# Patient Record
Sex: Female | Born: 1935 | Race: White | Hispanic: No | Marital: Married | Smoking: Never smoker
Health system: Southern US, Community
[De-identification: ages and names within clinical notes are randomized; demographics above are authoritative.]

## PROBLEM LIST (undated history)

## (undated) DIAGNOSIS — M810 Age-related osteoporosis without current pathological fracture: Secondary | ICD-10-CM

## (undated) DIAGNOSIS — E119 Type 2 diabetes mellitus without complications: Secondary | ICD-10-CM

## (undated) DIAGNOSIS — E7800 Pure hypercholesterolemia, unspecified: Secondary | ICD-10-CM

## (undated) DIAGNOSIS — I10 Essential (primary) hypertension: Secondary | ICD-10-CM

## (undated) DIAGNOSIS — K219 Gastro-esophageal reflux disease without esophagitis: Secondary | ICD-10-CM

## (undated) HISTORY — PX: KNEE SURGERY: SHX244

## (undated) HISTORY — PX: ABDOMINAL HYSTERECTOMY: SHX81

## (undated) HISTORY — PX: APPENDECTOMY: SHX54

---

## 2004-09-02 ENCOUNTER — Ambulatory Visit: Payer: Self-pay | Admitting: Internal Medicine

## 2005-01-10 ENCOUNTER — Ambulatory Visit: Payer: Self-pay | Admitting: Gastroenterology

## 2005-03-06 ENCOUNTER — Ambulatory Visit: Payer: Self-pay | Admitting: Internal Medicine

## 2005-04-16 ENCOUNTER — Ambulatory Visit: Payer: Self-pay | Admitting: Internal Medicine

## 2005-07-30 ENCOUNTER — Ambulatory Visit: Payer: Self-pay | Admitting: Internal Medicine

## 2005-09-04 ENCOUNTER — Ambulatory Visit: Payer: Self-pay | Admitting: Internal Medicine

## 2005-11-21 ENCOUNTER — Other Ambulatory Visit: Payer: Self-pay

## 2005-12-02 ENCOUNTER — Inpatient Hospital Stay: Payer: Self-pay | Admitting: General Practice

## 2006-09-08 ENCOUNTER — Ambulatory Visit: Payer: Self-pay | Admitting: Internal Medicine

## 2006-12-07 ENCOUNTER — Ambulatory Visit: Payer: Self-pay | Admitting: Internal Medicine

## 2006-12-10 ENCOUNTER — Ambulatory Visit: Payer: Self-pay | Admitting: Internal Medicine

## 2006-12-17 ENCOUNTER — Ambulatory Visit: Payer: Self-pay | Admitting: Internal Medicine

## 2007-01-10 ENCOUNTER — Ambulatory Visit: Payer: Self-pay | Admitting: Internal Medicine

## 2007-01-14 ENCOUNTER — Ambulatory Visit: Payer: Self-pay | Admitting: Gastroenterology

## 2007-03-12 ENCOUNTER — Ambulatory Visit: Payer: Self-pay | Admitting: Internal Medicine

## 2007-03-17 ENCOUNTER — Ambulatory Visit: Payer: Self-pay | Admitting: Internal Medicine

## 2007-03-19 ENCOUNTER — Ambulatory Visit: Payer: Self-pay | Admitting: Internal Medicine

## 2007-04-12 ENCOUNTER — Ambulatory Visit: Payer: Self-pay | Admitting: Internal Medicine

## 2007-09-12 ENCOUNTER — Ambulatory Visit: Payer: Self-pay | Admitting: Internal Medicine

## 2007-09-13 ENCOUNTER — Ambulatory Visit: Payer: Self-pay | Admitting: Internal Medicine

## 2007-09-22 ENCOUNTER — Ambulatory Visit: Payer: Self-pay | Admitting: Internal Medicine

## 2007-09-29 ENCOUNTER — Ambulatory Visit: Payer: Self-pay | Admitting: Internal Medicine

## 2007-10-10 ENCOUNTER — Ambulatory Visit: Payer: Self-pay | Admitting: Internal Medicine

## 2008-09-11 ENCOUNTER — Ambulatory Visit: Payer: Self-pay | Admitting: Internal Medicine

## 2008-09-14 ENCOUNTER — Ambulatory Visit: Payer: Self-pay | Admitting: Internal Medicine

## 2008-09-27 ENCOUNTER — Ambulatory Visit: Payer: Self-pay | Admitting: Internal Medicine

## 2008-10-09 ENCOUNTER — Ambulatory Visit: Payer: Self-pay | Admitting: Internal Medicine

## 2009-06-07 ENCOUNTER — Ambulatory Visit: Payer: Self-pay | Admitting: Internal Medicine

## 2009-09-26 ENCOUNTER — Ambulatory Visit: Payer: Self-pay | Admitting: Internal Medicine

## 2010-09-27 ENCOUNTER — Ambulatory Visit: Payer: Self-pay | Admitting: Internal Medicine

## 2011-10-20 ENCOUNTER — Ambulatory Visit: Payer: Self-pay | Admitting: Internal Medicine

## 2012-09-20 ENCOUNTER — Ambulatory Visit: Payer: Self-pay | Admitting: Gastroenterology

## 2012-10-01 ENCOUNTER — Ambulatory Visit: Payer: Self-pay | Admitting: Gastroenterology

## 2012-10-02 ENCOUNTER — Ambulatory Visit: Payer: Self-pay | Admitting: Gastroenterology

## 2012-10-20 ENCOUNTER — Ambulatory Visit: Payer: Self-pay | Admitting: Internal Medicine

## 2012-10-29 ENCOUNTER — Ambulatory Visit: Payer: Self-pay | Admitting: Gastroenterology

## 2013-10-21 ENCOUNTER — Ambulatory Visit: Payer: Self-pay | Admitting: Internal Medicine

## 2014-04-05 ENCOUNTER — Ambulatory Visit: Payer: Self-pay | Admitting: Physician Assistant

## 2014-10-24 ENCOUNTER — Ambulatory Visit: Payer: Self-pay | Admitting: Internal Medicine

## 2015-10-29 ENCOUNTER — Other Ambulatory Visit: Payer: Self-pay | Admitting: Internal Medicine

## 2015-10-29 DIAGNOSIS — Z1231 Encounter for screening mammogram for malignant neoplasm of breast: Secondary | ICD-10-CM

## 2015-11-05 ENCOUNTER — Other Ambulatory Visit: Payer: Self-pay | Admitting: Internal Medicine

## 2015-11-05 ENCOUNTER — Ambulatory Visit
Admission: RE | Admit: 2015-11-05 | Discharge: 2015-11-05 | Disposition: A | Discharge status: Home / Self Care | Payer: Medicare Other | Source: Ambulatory Visit | Attending: Internal Medicine | Admitting: Internal Medicine

## 2015-11-05 DIAGNOSIS — Z1231 Encounter for screening mammogram for malignant neoplasm of breast: Secondary | ICD-10-CM

## 2016-06-08 ENCOUNTER — Ambulatory Visit
Admission: EM | Admit: 2016-06-08 | Discharge: 2016-06-08 | Disposition: A | Discharge status: Home / Self Care | Payer: Medicare Other | Attending: Family Medicine | Admitting: Family Medicine

## 2016-06-08 ENCOUNTER — Encounter: Payer: Self-pay | Admitting: Gynecology

## 2016-06-08 DIAGNOSIS — L02415 Cutaneous abscess of right lower limb: Secondary | ICD-10-CM | POA: Insufficient documentation

## 2016-06-08 DIAGNOSIS — R7303 Prediabetes: Secondary | ICD-10-CM | POA: Diagnosis not present

## 2016-06-08 DIAGNOSIS — Z7982 Long term (current) use of aspirin: Secondary | ICD-10-CM | POA: Diagnosis not present

## 2016-06-08 DIAGNOSIS — Z79899 Other long term (current) drug therapy: Secondary | ICD-10-CM | POA: Diagnosis not present

## 2016-06-08 HISTORY — DX: Age-related osteoporosis without current pathological fracture: M81.0

## 2016-06-08 HISTORY — DX: Gastro-esophageal reflux disease without esophagitis: K21.9

## 2016-06-08 HISTORY — DX: Essential (primary) hypertension: I10

## 2016-06-08 HISTORY — DX: Pure hypercholesterolemia, unspecified: E78.00

## 2016-06-08 HISTORY — DX: Type 2 diabetes mellitus without complications: E11.9

## 2016-06-08 MED ORDER — SULFAMETHOXAZOLE-TRIMETHOPRIM 800-160 MG PO TABS: 1.0000 | ORAL_TABLET | Freq: Two times a day (BID) | ORAL | 0 refills | Status: AC

## 2016-06-08 MED ORDER — SULFAMETHOXAZOLE-TRIMETHOPRIM 800-160 MG PO TABS: 1.0000 | ORAL_TABLET | Freq: Two times a day (BID) | ORAL | 0 refills | Status: DC

## 2016-06-08 MED ORDER — LIDOCAINE HCL (PF) 2 % IJ SOLN: 0.0000 mL | Freq: Once | INTRAMUSCULAR | Status: DC | PRN

## 2016-06-08 MED ORDER — LIDOCAINE-EPINEPHRINE 1 %-1:100000 IJ SOLN
10.0000 mL | Freq: Once | INTRAMUSCULAR | Status: AC
  Administered 2016-06-08: 10 mL via INTRADERMAL

## 2016-06-08 NOTE — ED Triage Notes (Signed)
Patient state lump at  Right inner thigh x couple days.Per patient her inner thigh rub together and she develop a lump/cyst at area.

## 2016-06-08 NOTE — ED Provider Notes (Signed)
CSN: [id number]     Arrival date & time 06/08/16  0932 History   First MD Initiated Contact with Patient 06/08/16 1006     Chief Complaint  Patient presents with  . Leg Problem   (Consider location/radiation/quality/duration/timing/severity/associated sxs/prior Treatment) For 3 days has noticed an red bump to rt inner thigh. Pt thought it was a bite from a bug. This morning awaken with a large knot to area with erythema to surrounding areas. Painful states that her legs are rubbing together making area worse. Has not taken anything for it. Pt is a pre diabetic. Has never had anything similar.       Past Medical History:  Diagnosis Date  . Diabetes mellitus without complication (HCC)   . GERD (gastroesophageal reflux disease)   . Hypercholesteremia   . Hypertension   . Osteoporosis    Past Surgical History:  Procedure Laterality Date  . ABDOMINAL HYSTERECTOMY    . APPENDECTOMY    . KNEE SURGERY Bilateral    Family History  Problem Relation Age of Onset  . Breast cancer Mother     5780's   Social History  Substance Use Topics  . Smoking status: Never Smoker  . Smokeless tobacco: Never Used  . Alcohol use No   OB History    No data available     Review of Systems  Constitutional: Negative.   Respiratory: Negative.   Cardiovascular: Negative.   Skin: Positive for wound.       Red area with a head to bump to rt inner thigh.     Allergies  Bisphosphonates and Codeine  Home Medications   Prior to Admission medications   Medication Sig Start Date End Date Taking? Authorizing Provider  amLODipine (NORVASC) 5 MG tablet Take 5 mg by mouth daily.   Yes Historical Provider, MD  aspirin EC 81 MG tablet Take 81 mg by mouth daily.   Yes Historical Provider, MD  atorvastatin (LIPITOR) 10 MG tablet Take 10 mg by mouth daily.   Yes Historical Provider, MD  calcium citrate (CALCITRATE - DOSED IN MG ELEMENTAL CALCIUM) 950 MG tablet Take 200 mg of elemental calcium by mouth  daily.   Yes Historical Provider, MD  ezetimibe (ZETIA) 10 MG tablet Take 10 mg by mouth daily.   Yes Historical Provider, MD  gabapentin (NEURONTIN) 300 MG capsule Take 300 mg by mouth 3 (three) times daily.   Yes Historical Provider, MD  losartan-hydrochlorothiazide (HYZAAR) 100-25 MG tablet Take 1 tablet by mouth daily.   Yes Historical Provider, MD  Multiple Vitamin (MULTIVITAMIN) capsule Take 1 capsule by mouth daily.   Yes Historical Provider, MD  sulfamethoxazole-trimethoprim (BACTRIM DS,SEPTRA DS) 800-160 MG tablet Take 1 tablet by mouth 2 (two) times daily. 06/08/16 06/15/16  Tobi BastosMelanie A Keirah Konitzer, NP   Meds Ordered and Administered this Visit   Medications  lidocaine-EPINEPHrine (XYLOCAINE W/EPI) 1 %-1:100000 (with pres) injection 10 mL (not administered)  lidocaine (XYLOCAINE) 2 % injection 0-20 mL (not administered)    BP 123/63 (BP Location: Left Arm)   Pulse 83   Temp 97.4 F (36.3 C) (Oral)   Resp 16   Ht 5\' 2"  (1.575 m)   Wt 201 lb (91.2 kg)   SpO2 98%   BMI 36.76 kg/m  No data found.   Physical Exam  Constitutional: She appears well-developed.  Cardiovascular: Normal rate and regular rhythm.   Pulmonary/Chest: Effort normal and breath sounds normal.  Neurological: She is alert.  Skin: There is erythema.  Quarter  size raised area with 1in erythema surrounding area. No drainage.     Urgent Care Course   Clinical Course    .Marland Kitchen.Incision and Drainage Date/Time: 06/08/2016 10:38 AM Performed by: Maple MirzaMITCHELL, Zayah Keilman A Authorized by: Hassan RowanWADE, EUGENE   Consent:    Consent obtained:  Verbal   Consent given by:  Patient   Risks discussed:  Pain, infection and incomplete drainage   Alternatives discussed:  No treatment Location:    Type:  Abscess   Location:  Lower extremity   Lower extremity location:  Leg   Leg location:  R upper leg Pre-procedure details:    Skin preparation:  Betadine (pt states that she has used betadine with no side effects ) Anesthesia (see  MAR for exact dosages):    Anesthesia method:  Local infiltration   Local anesthetic:  Lidocaine 1% WITH epi Procedure type:    Complexity:  Complex Procedure details:    Needle aspiration: no     Incision types:  Single straight   Incision depth:  Dermal   Scalpel blade:  10   Wound management:  Probed and deloculated   Drainage:  Purulent and serosanguinous   Drainage amount:  Moderate   Wound treatment: packing    Packing materials:  1/4 in iodoform gauze Post-procedure details:    Patient tolerance of procedure:  Tolerated well, no immediate complications Comments:     RN stephen at bedside during procedure.    (including critical care time)  Labs Review Labs Reviewed - No data to display  Imaging Review No results found.           MDM   1. Abscess of right thigh    Discussed to take full abx High risk of non healing due to pre diabetic  Wash and dry area well return in 24 hrs for wound check.  Dressing placed on area if it is soiled may change  Discussed/education the risk and cause for abscess.     Tobi BastosMelanie A Standley Bargo, NP 06/08/16 1044

## 2016-06-09 ENCOUNTER — Ambulatory Visit
Admission: EM | Admit: 2016-06-09 | Discharge: 2016-06-09 | Disposition: A | Discharge status: Home / Self Care | Payer: Medicare Other | Attending: Family Medicine | Admitting: Family Medicine

## 2016-06-09 DIAGNOSIS — L02415 Cutaneous abscess of right lower limb: Secondary | ICD-10-CM

## 2016-06-09 NOTE — ED Provider Notes (Signed)
[facility]    CSN: [id number] Arrival date & time: 06/09/16  1115     History   Chief Complaint Chief Complaint  Patient presents with  . Abscess    wound check    HPI Joy Sanchez is a 81 y.o. female.   81 yo female with a c/o abscess to right inner thigh that was incised and drained yesterday here. Today patient is here for recheck/follow-up. No new concerns or complaints. Denies any fevers, chills. Started on Bactrim yesterday.    The history is provided by the patient.    Past Medical History:  Diagnosis Date  . Diabetes mellitus without complication (HCC)   . GERD (gastroesophageal reflux disease)   . Hypercholesteremia   . Hypertension   . Osteoporosis     There are no active problems to display for this patient.   Past Surgical History:  Procedure Laterality Date  . ABDOMINAL HYSTERECTOMY    . APPENDECTOMY    . KNEE SURGERY Bilateral     OB History    No data available       Home Medications    Prior to Admission medications   Medication Sig Start Date End Date Taking? Authorizing Provider  amLODipine (NORVASC) 5 MG tablet Take 5 mg by mouth daily.    Historical Provider, MD  aspirin EC 81 MG tablet Take 81 mg by mouth daily.    Historical Provider, MD  atorvastatin (LIPITOR) 10 MG tablet Take 10 mg by mouth daily.    Historical Provider, MD  calcium citrate (CALCITRATE - DOSED IN MG ELEMENTAL CALCIUM) 950 MG tablet Take 200 mg of elemental calcium by mouth daily.    Historical Provider, MD  ezetimibe (ZETIA) 10 MG tablet Take 10 mg by mouth daily.    Historical Provider, MD  gabapentin (NEURONTIN) 300 MG capsule Take 300 mg by mouth 3 (three) times daily.    Historical Provider, MD  losartan-hydrochlorothiazide (HYZAAR) 100-25 MG tablet Take 1 tablet by mouth daily.    Historical Provider, MD  Multiple Vitamin (MULTIVITAMIN) capsule Take 1 capsule by mouth daily.    Historical Provider, MD  sulfamethoxazole-trimethoprim  (BACTRIM DS,SEPTRA DS) 800-160 MG tablet Take 1 tablet by mouth 2 (two) times daily. 06/08/16 06/15/16  Tobi BastosMelanie A Mitchell, NP    Family History Family History  Problem Relation Age of Onset  . Breast cancer Mother     8180's    Social History Social History  Substance Use Topics  . Smoking status: Never Smoker  . Smokeless tobacco: Never Used  . Alcohol use No     Allergies   Bisphosphonates and Codeine   Review of Systems Review of Systems   Physical Exam Triage Vital Signs ED Triage Vitals  Enc Vitals Group     BP 06/09/16 1153 (!) 118/59     Pulse Rate 06/09/16 1153 96     Resp 06/09/16 1153 18     Temp 06/09/16 1153 98 F (36.7 C)     Temp Source 06/09/16 1153 Oral     SpO2 06/09/16 1153 97 %     Weight 06/09/16 1152 201 lb (91.2 kg)     Height 06/09/16 1152 5\' 2"  (1.575 m)     Head Circumference --      Peak Flow --      Pain Score 06/09/16 1153 0     Pain Loc --      Pain Edu? --      Excl. in GC? --  No data found.   Updated Vital Signs BP (!) 118/59 (BP Location: Left Arm)   Pulse 96   Temp 98 F (36.7 C) (Oral)   Resp 18   Ht 5\' 2"  (1.575 m)   Wt 201 lb (91.2 kg)   SpO2 97%   BMI 36.76 kg/m   Visual Acuity Right Eye Distance:   Left Eye Distance:   Bilateral Distance:    Right Eye Near:   Left Eye Near:    Bilateral Near:     Physical Exam  Constitutional: She appears well-developed and well-nourished. No distress.  Skin: She is not diaphoretic.  Right upper inner thigh surgical wound with mild purulent-sanguinous drainage, surrounding erythema and mild tenderness to palpation;  slight amount of packing removed  Nursing note and vitals reviewed.    UC Treatments / Results  Labs (all labs ordered are listed, but only abnormal results are displayed) Labs Reviewed - No data to display  EKG  EKG Interpretation None       Radiology No results found.  Procedures Procedures (including critical care time)  Medications  Ordered in UC Medications - No data to display   Initial Impression / Assessment and Plan / UC Course  I have reviewed the triage vital signs and the nursing notes.  Pertinent labs & imaging results that were available during my care of the patient were reviewed by me and considered in my medical decision making (see chart for details).  Clinical Course      Final Clinical Impressions(s) / UC Diagnoses   Final diagnoses:  Abscess of right leg    New Prescriptions Discharge Medication List as of 06/09/2016 12:10 PM     1. diagnosis reviewed with patient 2. Recommend continue current antibiotic and supportive treatment with warm compresses 3. No further packing needed; superficial guaze dressing applied 4. Follow-up with PCP in 2-3 days (or here if unable to see PCP);  prn if symptoms worsen or don't improve   Payton Mccallumrlando Symon Norwood, MD 06/09/16 1709

## 2016-06-09 NOTE — ED Triage Notes (Signed)
Pt was here yesterday and had an abscess lanced and was told to follow up today for a wound recheck.

## 2016-06-09 NOTE — Discharge Instructions (Signed)
Continue and finish current antibiotic Follow up in 2-3 days with Primary doctor for recheck of wound (or here if unable to get in with your doctor)

## 2016-06-10 ENCOUNTER — Encounter: Payer: Self-pay | Admitting: Emergency Medicine

## 2016-06-10 ENCOUNTER — Emergency Department
Admission: EM | Admit: 2016-06-10 | Discharge: 2016-06-10 | Disposition: A | Discharge status: Home / Self Care | Payer: Medicare Other | Attending: Emergency Medicine | Admitting: Emergency Medicine

## 2016-06-10 DIAGNOSIS — I10 Essential (primary) hypertension: Secondary | ICD-10-CM | POA: Diagnosis not present

## 2016-06-10 DIAGNOSIS — T378X5A Adverse effect of other specified systemic anti-infectives and antiparasitics, initial encounter: Secondary | ICD-10-CM | POA: Insufficient documentation

## 2016-06-10 DIAGNOSIS — E860 Dehydration: Secondary | ICD-10-CM

## 2016-06-10 DIAGNOSIS — E119 Type 2 diabetes mellitus without complications: Secondary | ICD-10-CM | POA: Diagnosis not present

## 2016-06-10 DIAGNOSIS — L02415 Cutaneous abscess of right lower limb: Secondary | ICD-10-CM | POA: Insufficient documentation

## 2016-06-10 DIAGNOSIS — Z79899 Other long term (current) drug therapy: Secondary | ICD-10-CM | POA: Insufficient documentation

## 2016-06-10 DIAGNOSIS — Z09 Encounter for follow-up examination after completed treatment for conditions other than malignant neoplasm: Secondary | ICD-10-CM

## 2016-06-10 DIAGNOSIS — T50905A Adverse effect of unspecified drugs, medicaments and biological substances, initial encounter: Secondary | ICD-10-CM

## 2016-06-10 DIAGNOSIS — R5383 Other fatigue: Secondary | ICD-10-CM | POA: Diagnosis present

## 2016-06-10 DIAGNOSIS — Z7982 Long term (current) use of aspirin: Secondary | ICD-10-CM | POA: Insufficient documentation

## 2016-06-10 LAB — COMPREHENSIVE METABOLIC PANEL
ALBUMIN: 4.1 g/dL (ref 3.5–5.0)
ALK PHOS: 55 U/L (ref 38–126)
ALT: 21 U/L (ref 14–54)
AST: 27 U/L (ref 15–41)
Anion gap: 12 (ref 5–15)
BUN: 32 mg/dL — ABNORMAL HIGH (ref 6–20)
CALCIUM: 9.6 mg/dL (ref 8.9–10.3)
CHLORIDE: 96 mmol/L — AB (ref 101–111)
CO2: 25 mmol/L (ref 22–32)
CREATININE: 1.59 mg/dL — AB (ref 0.44–1.00)
GFR calc non Af Amer: 30 mL/min — ABNORMAL LOW (ref >60)
GFR, EST AFRICAN AMERICAN: 35 mL/min — AB (ref >60)
GLUCOSE: 129 mg/dL — AB (ref 65–99)
Potassium: 3.5 mmol/L (ref 3.5–5.1)
SODIUM: 133 mmol/L — AB (ref 135–145)
Total Bilirubin: 0.5 mg/dL (ref 0.3–1.2)
Total Protein: 7.9 g/dL (ref 6.5–8.1)

## 2016-06-10 LAB — CBC WITH DIFFERENTIAL/PLATELET
BASOS ABS: 0 10*3/uL (ref 0–0.1)
BASOS PCT: 0 %
EOS ABS: 0.1 10*3/uL (ref 0–0.7)
EOS PCT: 1 %
HCT: 39.7 % (ref 35.0–47.0)
HEMOGLOBIN: 13.4 g/dL (ref 12.0–16.0)
LYMPHS ABS: 2.2 10*3/uL (ref 1.0–3.6)
Lymphocytes Relative: 19 %
MCH: 32.7 pg (ref 26.0–34.0)
MCHC: 33.7 g/dL (ref 32.0–36.0)
MCV: 96.9 fL (ref 80.0–100.0)
Monocytes Absolute: 1.3 10*3/uL — ABNORMAL HIGH (ref 0.2–0.9)
Monocytes Relative: 11 %
NEUTROS PCT: 69 %
Neutro Abs: 7.9 10*3/uL — ABNORMAL HIGH (ref 1.4–6.5)
PLATELETS: 228 10*3/uL (ref 150–440)
RBC: 4.09 MIL/uL (ref 3.80–5.20)
RDW: 13.6 % (ref 11.5–14.5)
WBC: 11.5 10*3/uL — AB (ref 3.6–11.0)

## 2016-06-10 MED ORDER — SODIUM CHLORIDE 0.9 % IV SOLN
1000.0000 mL | Freq: Once | INTRAVENOUS | Status: AC
  Administered 2016-06-10: 1000 mL via INTRAVENOUS

## 2016-06-10 NOTE — ED Triage Notes (Signed)
[facility] and had abscess lance couple of days ago  Then packing was removed yesterday    Was placed on Septra ds  States she thinks the meds are too strong

## 2016-06-10 NOTE — ED Triage Notes (Signed)
Pt reports general malaise, decreased appetite. Pt denies nausea, vomiting or diarrhea. Pt denies fever at home.

## 2016-06-10 NOTE — ED Provider Notes (Signed)
[facility] Emergency Department Provider Note   ____________________________________________    I have reviewed the triage vital signs and the nursing notes.   HISTORY  Chief Complaint Malaise and Medication Reaction     HPI Joy Sanchez is a 81 y.o. female who presents with complaints of "not feeling well. Patient reports she was recently treated at urgent care for an abscess which was indeed. She was started on Bactrim. She has taken 3 days worth of Bactrim and reports that she is feeling fatigued and weak. She denies fevers or chills. She reports her abscess is healing nicely. No nausea or vomiting. She feels the medication is to blame for her symptoms.   Past Medical History:  Diagnosis Date  . Diabetes mellitus without complication (HCC)   . GERD (gastroesophageal reflux disease)   . Hypercholesteremia   . Hypertension   . Osteoporosis     There are no active problems to display for this patient.   Past Surgical History:  Procedure Laterality Date  . ABDOMINAL HYSTERECTOMY    . APPENDECTOMY    . KNEE SURGERY Bilateral     Prior to Admission medications   Medication Sig Start Date End Date Taking? Authorizing Provider  amLODipine (NORVASC) 5 MG tablet Take 5 mg by mouth daily.    Historical Provider, MD  aspirin EC 81 MG tablet Take 81 mg by mouth daily.    Historical Provider, MD  atorvastatin (LIPITOR) 10 MG tablet Take 10 mg by mouth daily.    Historical Provider, MD  calcium citrate (CALCITRATE - DOSED IN MG ELEMENTAL CALCIUM) 950 MG tablet Take 200 mg of elemental calcium by mouth daily.    Historical Provider, MD  ezetimibe (ZETIA) 10 MG tablet Take 10 mg by mouth daily.    Historical Provider, MD  gabapentin (NEURONTIN) 300 MG capsule Take 300 mg by mouth 3 (three) times daily.    Historical Provider, MD  losartan-hydrochlorothiazide (HYZAAR) 100-25 MG tablet Take 1 tablet by mouth daily.    Historical Provider, MD    Multiple Vitamin (MULTIVITAMIN) capsule Take 1 capsule by mouth daily.    Historical Provider, MD  sulfamethoxazole-trimethoprim (BACTRIM DS,SEPTRA DS) 800-160 MG tablet Take 1 tablet by mouth 2 (two) times daily. 06/08/16 06/15/16  Tobi BastosMelanie A Mitchell, NP     Allergies Bisphosphonates and Codeine  Family History  Problem Relation Age of Onset  . Breast cancer Mother     4980's    Social History Social History  Substance Use Topics  . Smoking status: Never Smoker  . Smokeless tobacco: Never Used  . Alcohol use No    Review of Systems  Constitutional: No fever/chills. Fatigue as above Eyes: No visual changes.  ENT: No sore throat. Cardiovascular: Denies chest pain. Respiratory: Denies shortness of breath. Gastrointestinal: No abdominal pain.  No nausea, no vomiting.   Genitourinary: Negative for dysuria. Musculoskeletal: Negative for back pain. Skin: Healing abscess Neurological: Negative for headaches or weakness  10-point ROS otherwise negative.  ____________________________________________   PHYSICAL EXAM:  VITAL SIGNS: ED Triage Vitals  Enc Vitals Group     BP 06/10/16 1823 (!) 146/71     Pulse Rate 06/10/16 1823 (!) 110     Resp 06/10/16 1823 18     Temp 06/10/16 1823 98.8 F (37.1 C)     Temp Source 06/10/16 1823 Oral     SpO2 06/10/16 1823 99 %     Weight 06/10/16 1824 201 lb (91.2 kg)  Height 06/10/16 1824 5\' 2"  (1.575 m)     Head Circumference --      Peak Flow --      Pain Score 06/10/16 1824 0     Pain Loc --      Pain Edu? --      Excl. in GC? --     Constitutional: Alert and oriented. No acute distress. Pleasant and interactive Eyes: Conjunctivae are normal.   Nose: No congestion/rhinnorhea. Mouth/Throat: Mucous membranes are moist.    Cardiovascular: Normal rate, regular rhythm. Grossly normal heart sounds.  Good peripheral circulation. Respiratory: Normal respiratory effort.  No retractions. Lungs CTAB. Gastrointestinal: Soft and  nontender. No distention.  No CVA tenderness. Genitourinary: deferred Musculoskeletal: No lower extremity tenderness nor edema.  Warm and well perfused Neurologic:  Normal speech and language. No gross focal neurologic deficits are appreciated.  Skin:  Skin is warm, dry. Healing abscess right inner thigh proximal thigh, no cellulitis or crepitus Psychiatric: Mood and affect are normal. Speech and behavior are normal.  ____________________________________________   LABS (all labs ordered are listed, but only abnormal results are displayed)  Labs Reviewed  CBC WITH DIFFERENTIAL/PLATELET - Abnormal; Notable for the following:       Result Value   WBC 11.5 (*)    Neutro Abs 7.9 (*)    Monocytes Absolute 1.3 (*)    All other components within normal limits  COMPREHENSIVE METABOLIC PANEL - Abnormal; Notable for the following:    Sodium 133 (*)    Chloride 96 (*)    Glucose, Bld 129 (*)    BUN 32 (*)    Creatinine, Ser 1.59 (*)    GFR calc non Af Amer 30 (*)    GFR calc Af Amer 35 (*)    All other components within normal limits  URINALYSIS COMPLETEWITH MICROSCOPIC ([facility] ONLY)   ____________________________________________  EKG  None ____________________________________________  RADIOLOGY  None ____________________________________________   PROCEDURES  Procedure(s) performed: No    Critical Care performed: No ____________________________________________   INITIAL IMPRESSION / ASSESSMENT AND PLAN / ED COURSE  Pertinent labs & imaging results that were available during my care of the patient were reviewed by me and considered in my medical decision making (see chart for details).  Patient well-appearing no distress. Her exam is benign. Abscess appears to be healing well. No tenderness or spreading redness or evidence of infection. Lab work is overall unremarkable. She does have mild elevation in BUN and creatinine which could explain her fatigue. IV fluids given in  the emergency department which she reports helped significantly and also improved her heart rate. Recommended stopping the Bactrim. She has follow-up with her PCP in 2 days. Return precautions discussed  Clinical Course   ____________________________________________   FINAL CLINICAL IMPRESSION(S) / ED DIAGNOSES  Final diagnoses:  Adverse effect of drug, initial encounter  Dehydration  Encounter for recheck of abscess following incision and drainage      NEW MEDICATIONS STARTED DURING THIS VISIT:  Discharge Medication List as of 06/10/2016  8:45 PM       Note:  This document was prepared using Dragon voice recognition software and may include unintentional dictation errors.    Jene Everyobert Mitchelle Goerner, MD 06/10/16 2158

## 2016-06-10 NOTE — ED Notes (Signed)
Discharge instructions reviewed with patient. Questions fielded by this RN. Patient verbalizes understanding of instructions. Patient discharged home in stable condition per Kinner MD . No acute distress noted at time of discharge.   

## 2016-06-10 NOTE — ED Notes (Signed)
Patient denies signs of redness, warmth, drainage or fevers at home.  States feels "a little bit weak" and decrease in appetite.

## 2016-06-11 LAB — AEROBIC CULTURE W GRAM STAIN (SUPERFICIAL SPECIMEN)

## 2016-06-11 LAB — AEROBIC CULTURE  (SUPERFICIAL SPECIMEN)

## 2016-06-15 ENCOUNTER — Telehealth: Payer: Self-pay | Admitting: *Deleted

## 2016-06-15 NOTE — Telephone Encounter (Signed)
Called patient, verified DOB, communicated bacteria found in wound culture. Patient did follow up with her PCP which prescribed her doxycycline. PCP also referred patient to wound care center. Patient states that she is feeling some better, but wound drainage is still present. Advised patient to keep appointment with wound care center in 3 days.

## 2016-06-18 ENCOUNTER — Encounter: Payer: Medicare Other | Attending: Nurse Practitioner | Admitting: Nurse Practitioner

## 2016-06-18 DIAGNOSIS — Z96653 Presence of artificial knee joint, bilateral: Secondary | ICD-10-CM | POA: Insufficient documentation

## 2016-06-18 DIAGNOSIS — M1990 Unspecified osteoarthritis, unspecified site: Secondary | ICD-10-CM | POA: Insufficient documentation

## 2016-06-18 DIAGNOSIS — E7800 Pure hypercholesterolemia, unspecified: Secondary | ICD-10-CM | POA: Insufficient documentation

## 2016-06-18 DIAGNOSIS — I10 Essential (primary) hypertension: Secondary | ICD-10-CM | POA: Diagnosis not present

## 2016-06-18 DIAGNOSIS — L02415 Cutaneous abscess of right lower limb: Secondary | ICD-10-CM | POA: Insufficient documentation

## 2016-06-18 DIAGNOSIS — E08622 Diabetes mellitus due to underlying condition with other skin ulcer: Secondary | ICD-10-CM | POA: Diagnosis not present

## 2016-06-20 NOTE — Progress Notes (Signed)
Joy Sanchez, Mardi T. ([id number]) Visit Report for 06/18/2016 Allergy List Details Patient Name: Joy Sanchez, Joy T. Date of Service: 06/18/2016 8:00 AM Medical Record Number: [id number] Patient Account Number: [id number] Date of Birth/Sex: 1935/04/07 (81 y.o. Female) Treating RN: Huel CoventryWoody, Kim Primary Care Physician: Jodi MourningWalker III, John Other Clinician: Referring Physician: Jodi MourningWalker III, John Treating Physician/Extender: Kathreen Cosieroulter, Leah Weeks in Treatment: 0 Allergies Active Allergies codeine Iodinated Contrast- Oral and IV Dye Bactrim Allergy Notes Electronic Signature(s) Signed: 06/19/2016 5:39:47 PM By: Elliot GurneyWoody, RN, BSN, Kim RN, BSN Entered By: Elliot GurneyWoody, RN, BSN, Kim on 06/18/2016 08:12:17 Griggs, Kate SableFAYE T. ([id number]) -------------------------------------------------------------------------------- Arrival Information Details Patient Name: Joy Sanchez, Taje T. Date of Service: 06/18/2016 8:00 AM Medical Record Number: [id number] Patient Account Number: [id number] Date of Birth/Sex: 1935/04/07 (81 y.o. Female) Treating RN: Huel CoventryWoody, Kim Primary Care Physician: Jodi MourningWalker III, John Other Clinician: Referring Physician: Jodi MourningWalker III, John Treating Physician/Extender: Kathreen Cosieroulter, Leah Weeks in Treatment: 0 Visit Information Patient Arrived: Ambulatory Arrival Time: 08:05 Accompanied By: self Transfer Assistance: None Patient Identification Verified: Yes Secondary Verification Process Yes Completed: Patient Has Alerts: Yes Patient Alerts: Patient on Blood Thinner 81mg  asprirn DM II-Diet Controlled 9/17 A1C 6.4 Electronic Signature(s) Signed: 06/19/2016 5:39:47 PM By: Elliot GurneyWoody, RN, BSN, Kim RN, BSN Entered By: Elliot GurneyWoody, RN, BSN, Kim on 06/18/2016 09:08:19 Magallanes, Kate SableFAYE T. ([id number]) -------------------------------------------------------------------------------- Clinic Level of Care Assessment Details Patient Name: Joy Sanchez, Joy T. Date of Service: 06/18/2016 8:00 AM Medical Record Number: [id number] Patient Account Number:  [id number] Date of Birth/Sex: 1935/04/07 (81 y.o. Female) Treating RN: Huel CoventryWoody, Kim Primary Care Physician: Jodi MourningWalker III, John Other Clinician: Referring Physician: Jodi MourningWalker III, John Treating Physician/Extender: Kathreen Cosieroulter, Leah Weeks in Treatment: 0 Clinic Level of Care Assessment Items TOOL 4 Quantity Score []  - Use when only an EandM is performed on FOLLOW-UP visit 0 ASSESSMENTS - Nursing Assessment / Reassessment []  - Reassessment of Co-morbidities (includes updates in patient status) 0 X - Reassessment of Adherence to Treatment Plan 1 5 ASSESSMENTS - Wound and Skin Assessment / Reassessment X - Simple Wound Assessment / Reassessment - one wound 1 5 []  - Complex Wound Assessment / Reassessment - multiple wounds 0 []  - Dermatologic / Skin Assessment (not related to wound area) 0 ASSESSMENTS - Focused Assessment []  - Circumferential Edema Measurements - multi extremities 0 []  - Nutritional Assessment / Counseling / Intervention 0 []  - Lower Extremity Assessment (monofilament, tuning fork, pulses) 0 []  - Peripheral Arterial Disease Assessment (using hand held doppler) 0 ASSESSMENTS - Ostomy and/or Continence Assessment and Care []  - Incontinence Assessment and Management 0 []  - Ostomy Care Assessment and Management (repouching, etc.) 0 PROCESS - Coordination of Care X - Simple Patient / Family Education for ongoing care 1 15 []  - Complex (extensive) Patient / Family Education for ongoing care 0 X - Staff obtains ChiropractorConsents, Records, Test Results / Process Orders 1 10 []  - Staff telephones HHA, Nursing Homes / Clarify orders / etc 0 []  - Routine Transfer to another Facility (non-emergent condition) 0 Leiphart, Dillyn T. ([id number]) []  - Routine Hospital Admission (non-emergent condition) 0 X - New Admissions / Manufacturing engineernsurance Authorizations / Ordering NPWT, Apligraf, etc. 1 15 []  - Emergency Hospital Admission (emergent condition) 0 X - Simple Discharge Coordination 1 10 []  - Complex (extensive)  Discharge Coordination 0 PROCESS - Special Needs []  - Pediatric / Minor Patient Management 0 []  - Isolation Patient Management 0 []  - Hearing / Language / Visual special needs 0 []  - Assessment of Community assistance (transportation, D/C planning, etc.) 0 []  - Additional assistance /  Altered mentation 0 []  - Support Surface(s) Assessment (bed, cushion, seat, etc.) 0 INTERVENTIONS - Wound Cleansing / Measurement X - Simple Wound Cleansing - one wound 1 5 []  - Complex Wound Cleansing - multiple wounds 0 X - Wound Imaging (photographs - any number of wounds) 1 5 []  - Wound Tracing (instead of photographs) 0 X - Simple Wound Measurement - one wound 1 5 []  - Complex Wound Measurement - multiple wounds 0 INTERVENTIONS - Wound Dressings X - Small Wound Dressing one or multiple wounds 1 10 []  - Medium Wound Dressing one or multiple wounds 0 []  - Large Wound Dressing one or multiple wounds 0 []  - Application of Medications - topical 0 []  - Application of Medications - injection 0 INTERVENTIONS - Miscellaneous []  - External ear exam 0 Sauve, Shelby T. ([id number]) []  - Specimen Collection (cultures, biopsies, blood, body fluids, etc.) 0 []  - Specimen(s) / Culture(s) sent or taken to Lab for analysis 0 []  - Patient Transfer (multiple staff / Michiel Sites Lift / Similar devices) 0 []  - Simple Staple / Suture removal (25 or less) 0 []  - Complex Staple / Suture removal (26 or more) 0 []  - Hypo / Hyperglycemic Management (close monitor of Blood Glucose) 0 []  - Ankle / Brachial Index (ABI) - do not check if billed separately 0 X - Vital Signs 1 5 Has the patient been seen at the hospital within the last three years: Yes Total Score: 90 Level Of Care: New/Established - Level 3 Electronic Signature(s) Signed: 06/19/2016 5:39:47 PM By: Elliot Gurney, RN, BSN, Kim RN, BSN Entered By: Elliot Gurney, RN, BSN, Kim on 06/18/2016 09:08:57 Rinehimer, Kate Sable  ([id number]) -------------------------------------------------------------------------------- Encounter Discharge Information Details Patient Name: Joy Nakai T. Date of Service: 06/18/2016 8:00 AM Medical Record Number: [id number] Patient Account Number: [id number] Date of Birth/Sex: Apr 01, 1935 (81 y.o. Female) Treating RN: Huel Coventry Primary Care Physician: Jodi Mourning Other Clinician: Referring Physician: Jodi Mourning Treating Physician/Extender: Kathreen Cosier in Treatment: 0 Encounter Discharge Information Items Discharge Pain Level: 0 Discharge Condition: Stable Ambulatory Status: Ambulatory Discharge Destination: Home Transportation: Private Auto Accompanied By: self Schedule Follow-up Appointment: Yes Medication Reconciliation completed and provided to Patient/Care Yes Giorgi Debruin: Provided on Clinical Summary of Care: 06/18/2016 Form Type Recipient Paper Patient FM Electronic Signature(s) Signed: 06/18/2016 9:21:33 AM By: Gwenlyn Perking Entered By: Gwenlyn Perking on 06/18/2016 09:21:32 Bohlken, Kate Sable ([id number]) -------------------------------------------------------------------------------- Lower Extremity Assessment Details Patient Name: Joy Nakai T. Date of Service: 06/18/2016 8:00 AM Medical Record Number: [id number] Patient Account Number: [id number] Date of Birth/Sex: 12-14-1934 (81 y.o. Female) Treating RN: Huel Coventry Primary Care Physician: Jodi Mourning Other Clinician: Referring Physician: Jodi Mourning Treating Physician/Extender: Kathreen Cosier in Treatment: 0 Electronic Signature(s) Signed: 06/19/2016 5:39:47 PM By: Elliot Gurney, RN, BSN, Kim RN, BSN Entered By: Elliot Gurney, RN, BSN, Kim on 06/18/2016 08:35:42 Shelnutt, Kate Sable ([id number]) -------------------------------------------------------------------------------- Multi Wound Chart Details Patient Name: Joy Nakai T. Date of Service: 06/18/2016 8:00 AM Medical Record Number: [id number] Patient  Account Number: [id number] Date of Birth/Sex: 01/26/1935 (81 y.o. Female) Treating RN: Huel Coventry Primary Care Physician: Jodi Mourning Other Clinician: Referring Physician: Jodi Mourning Treating Physician/Extender: Kathreen Cosier in Treatment: 0 Vital Signs Height(in): 62 Pulse(bpm): 74 Weight(lbs): 201 Blood Pressure 143/72 (mmHg): Body Mass Index(BMI): 37 Temperature(F): 97.5 Respiratory Rate 16 (breaths/min): Photos: [N/A:N/A] Wound Location: Right Upper Leg - Medial N/A N/A Wounding Event: Gradually Appeared N/A N/A Primary Etiology: Diabetic Wound/Ulcer of N/A N/A the Lower Extremity Secondary Etiology: Abscess  N/A N/A Comorbid History: Cataracts, Hypertension, N/A N/A Type II Diabetes, Osteoarthritis Date Acquired: 06/04/2016 N/A N/A Weeks of Treatment: 0 N/A N/A Wound Status: Open N/A N/A Measurements L x W x D 0.5x0.4x0.2 N/A N/A (cm) Area (cm) : 0.157 N/A N/A Volume (cm) : 0.031 N/A N/A Starting Position 1 8 (o'clock): Ending Position 1 12 (o'clock): Maximum Distance 1 0.3 (cm): Undermining: Yes N/A N/A Classification: Grade 2 N/A N/A Storlie, Madeliene T. ([id number]) Exudate Amount: Medium N/A N/A Exudate Type: Serosanguineous N/A N/A Exudate Color: red, brown N/A N/A Wound Margin: Flat and Intact N/A N/A Granulation Amount: None Present (0%) N/A N/A Necrotic Amount: Small (1-33%) N/A N/A Necrotic Tissue: Eschar N/A N/A Epithelialization: None N/A N/A Periwound Skin Texture: Induration: Yes N/A N/A Edema: No Excoriation: No Callus: No Crepitus: No Fluctuance: No Friable: No Rash: No Scarring: No Periwound Skin Moist: Yes N/A N/A Moisture: Maceration: No Dry/Scaly: No Periwound Skin Color: Ecchymosis: Yes N/A N/A Atrophie Blanche: No Cyanosis: No Erythema: No Hemosiderin Staining: No Mottled: No Pallor: No Rubor: No Temperature: No Abnormality N/A N/A Tenderness on Yes N/A N/A Palpation: Wound Preparation: Ulcer  Cleansing: N/A N/A Rinsed/Irrigated with Saline Topical Anesthetic Applied: Other: lidpcaine 4% Treatment Notes Electronic Signature(s) Signed: 06/19/2016 5:39:47 PM By: Elliot GurneyWoody, RN, BSN, Kim RN, BSN Entered By: Elliot GurneyWoody, RN, BSN, Kim on 06/18/2016 08:49:13 Rueb, Kate SableFAYE T. ([id number]) -------------------------------------------------------------------------------- Multi-Disciplinary Care Plan Details Patient Name: Joy Sanchez, Zeniah T. Date of Service: 06/18/2016 8:00 AM Medical Record Number: [id number] Patient Account Number: [id number] Date of Birth/Sex: 1935-02-24 (81 y.o. Female) Treating RN: Huel CoventryWoody, Kim Primary Care Physician: Jodi MourningWalker III, John Other Clinician: Referring Physician: Jodi MourningWalker III, John Treating Physician/Extender: Kathreen Cosieroulter, Leah Weeks in Treatment: 0 Active Inactive Abuse / Safety / Falls / Self Care Management Nursing Diagnoses: Potential for falls Goals: Patient will remain injury free Date Initiated: 06/18/2016 Goal Status: Active Interventions: Assess fall risk on admission and as needed Notes: Medication Nursing Diagnoses: Knowledge deficit related to medication safety: actual or potential Goals: Patient/caregiver will demonstrate understanding of all current medications Date Initiated: 06/18/2016 Goal Status: Active Interventions: Assess for medication contraindications each visit where new medications are prescribed Notes: Nutrition Nursing Diagnoses: Impaired glucose control: actual or potential Goals: Patient/caregiver verbalizes understanding of need to maintain therapeutic glucose control per primary care physician Joy Sanchez, Neesa T. ([id number]) Date Initiated: 06/18/2016 Goal Status: Active Interventions: Provide education on elevated blood sugars and impact on wound healing Notes: Orientation to the Wound Care Program Nursing Diagnoses: Knowledge deficit related to the wound healing center program Goals: Patient/caregiver will verbalize understanding of  the Wound Healing Center Program Date Initiated: 06/18/2016 Goal Status: Active Interventions: Provide education on orientation to the wound center Notes: Wound/Skin Impairment Nursing Diagnoses: Impaired tissue integrity Goals: Ulcer/skin breakdown will heal within 14 weeks Date Initiated: 06/18/2016 Goal Status: Active Interventions: Assess patient/caregiver ability to obtain necessary supplies Notes: Electronic Signature(s) Signed: 06/19/2016 5:39:47 PM By: Elliot GurneyWoody, RN, BSN, Kim RN, BSN Entered By: Elliot GurneyWoody, RN, BSN, Kim on 06/18/2016 08:48:29 Tappen, Kate SableFAYE T. ([id number]) -------------------------------------------------------------------------------- Pain Assessment Details Patient Name: Joy Sanchez, Julieanna T. Date of Service: 06/18/2016 8:00 AM Medical Record Number: [id number] Patient Account Number: [id number] Date of Birth/Sex: 1935-02-24 (81 y.o. Female) Treating RN: Huel CoventryWoody, Kim Primary Care Physician: Jodi MourningWalker III, John Other Clinician: Referring Physician: Jodi MourningWalker III, John Treating Physician/Extender: Kathreen Cosieroulter, Leah Weeks in Treatment: 0 Active Problems Location of Pain Severity and Description of Pain Patient Has Paino No Site Locations With Dressing Change: No Pain Management and Medication Current Pain Management: Electronic  Signature(s) Signed: 06/19/2016 5:39:47 PM By: Elliot Gurney, RN, BSN, Kim RN, BSN Entered By: Elliot Gurney, RN, BSN, Kim on 06/18/2016 08:07:46 Peppard, Kate Sable ([id number]) -------------------------------------------------------------------------------- Patient/Caregiver Education Details Patient Name: Joy Nakai T. Date of Service: 06/18/2016 8:00 AM Medical Record Number: [id number] Patient Account Number: [id number] Date of Birth/Gender: 01-27-1935 (81 y.o. Female) Treating RN: Huel Coventry Primary Care Physician: Jodi Mourning Other Clinician: Referring Physician: Jodi Mourning Treating Physician/Extender: Kathreen Cosier in Treatment: 0 Education  Assessment Education Provided To: Patient Education Topics Provided Elevated Blood Sugar/ Impact on Healing: Handouts: Elevated Blood Sugars: How Do They Affect Wound Healing Methods: Demonstration Responses: State content correctly Welcome To The [facility]: Handouts: Welcome To The [facility] Methods: Demonstration Responses: State content correctly Electronic Signature(s) Signed: 06/19/2016 5:39:47 PM By: Elliot Gurney, RN, BSN, Kim RN, BSN Entered By: Elliot Gurney, RN, BSN, Kim on 06/18/2016 09:19:51 Niemann, Kate Sable ([id number]) -------------------------------------------------------------------------------- Wound Assessment Details Patient Name: Joy Nakai T. Date of Service: 06/18/2016 8:00 AM Medical Record Number: [id number] Patient Account Number: [id number] Date of Birth/Sex: 02-22-1935 (81 y.o. Female) Treating RN: Huel Coventry Primary Care Physician: Jodi Mourning Other Clinician: Referring Physician: Jodi Mourning Treating Physician/Extender: Bonnell Public Weeks in Treatment: 0 Wound Status Wound Number: 1 Primary Diabetic Wound/Ulcer of the Lower Etiology: Extremity Wound Location: Right Upper Leg - Medial Secondary Abscess Wounding Event: Gradually Appeared Etiology: Date Acquired: 06/04/2016 Wound Status: Open Weeks Of Treatment: 0 Comorbid Cataracts, Hypertension, Type II Clustered Wound: No History: Diabetes, Osteoarthritis Photos Wound Measurements Length: (cm) 0.5 % Reduction in Width: (cm) 0.4 % Reduction in Depth: (cm) 0.2 Epithelializati Area: (cm) 0.157 Tunneling: Volume: (cm) 0.031 Undermining: Starting Pos Ending Posit Maximum Dist Area: Volume: on: None No Yes ition (o'clock): 8 ion (o'clock): 12 ance: (cm) 0.3 Wound Description Classification: Grade 2 Wound Margin: Flat and Intact Exudate Amount: Medium Exudate Type: Serosanguineous Exudate Color: red, brown Foul Odor After Cleansing: No Wound Bed Granulation Amount:  None Present (0%) Adderley, Valorie T. ([id number]) Necrotic Amount: Small (1-33%) Necrotic Quality: Eschar Periwound Skin Texture Texture Color No Abnormalities Noted: No No Abnormalities Noted: No Callus: No Atrophie Blanche: No Crepitus: No Cyanosis: No Excoriation: No Ecchymosis: Yes Fluctuance: No Erythema: No Friable: No Hemosiderin Staining: No Induration: Yes Mottled: No Localized Edema: No Pallor: No Rash: No Rubor: No Scarring: No Temperature / Pain Moisture Temperature: No Abnormality No Abnormalities Noted: No Tenderness on Palpation: Yes Dry / Scaly: No Maceration: No Moist: Yes Wound Preparation Ulcer Cleansing: Rinsed/Irrigated with Saline Topical Anesthetic Applied: Other: lidpcaine 4%, Treatment Notes Wound #1 (Right, Medial Upper Leg) 1. Cleansed with: Clean wound with Normal Saline 2. Anesthetic Topical Lidocaine 4% cream to wound bed prior to debridement 4. Dressing Applied: Plain packing gauze 5. Secondary Dressing Applied Bordered Foam Dressing Electronic Signature(s) Signed: 06/19/2016 5:39:47 PM By: Elliot Gurney, RN, BSN, Kim RN, BSN Entered By: Elliot Gurney, RN, BSN, Kim on 06/18/2016 08:38:59 Manzella, Kate Sable ([id number]) -------------------------------------------------------------------------------- Vitals Details Patient Name: Joy Nakai T. Date of Service: 06/18/2016 8:00 AM Medical Record Number: [id number] Patient Account Number: [id number] Date of Birth/Sex: [birth year] (81 y.o. Female) Treating RN: Huel Coventry Primary Care Physician: Jodi Mourning Other Clinician: Referring Physician: Jodi Mourning Treating Physician/Extender: Kathreen Cosier in Treatment: 0 Vital Signs Time Taken: 08:07 Temperature (F): 97.5 Height (in): 62 Pulse (bpm): 74 Weight (lbs): 201 Respiratory Rate (breaths/min): 16 Body Mass Index (BMI): 36.8 Blood Pressure (mmHg): 143/72 Reference Range: 80 - 120 mg / dl Electronic Signature(s) Signed:  06/19/2016  5:39:47 PM By: Elliot Gurney, RN, BSN, Kim RN, BSN Entered By: Elliot Gurney, RN, BSN, Kim on 06/18/2016 08:10:27

## 2016-06-20 NOTE — Progress Notes (Signed)
Joy MaineMISE, Alizea T. ([id number]) Visit Report for 06/18/2016 Chief Complaint Document Details Patient Name: Joy MaineMISE, Keagan T. 06/18/2016 8:00 Date of Service: AM Medical Record [id number] Number: Patient Account Number: [id number] [birth year] (81 y.o. Treating RN: Curtis Sitesorthy, Joanna Date of Birth/Sex: Female) Other Clinician: Primary Care Physician: Cleda ClarksWalker III, John Treating Piotr Christopher Referring Physician: Jodi MourningWalker III, John Physician/Extender: Tania AdeWeeks in Treatment: 0 Information Obtained from: Patient Chief Complaint Ms. Shackleton arrives for evaluation of right inner thigh abscess Electronic Signature(s) Signed: 06/19/2016 2:15:46 AM By: Bonnell Publicoulter, Arantxa Piercey Entered By: Bonnell Publicoulter, Scotty Weigelt on 06/19/2016 02:15:46 Jiminez, Kate SableFAYE T. ([id number]) -------------------------------------------------------------------------------- Debridement Details Patient Name: Joy Sanchez, Joy T. 06/18/2016 8:00 Date of Service: AM Medical Record [id number] Number: Patient Account Number: [id number] [birth year] (81 y.o. Treating RN: Curtis Sitesorthy, Joanna Date of Birth/Sex: Female) Other Clinician: Primary Care Physician: Cleda ClarksWalker III, John Treating Ark Agrusa Referring Physician: Jodi MourningWalker III, John Physician/Extender: Tania AdeWeeks in Treatment: 0 Debridement Performed for Wound #1 Right,Medial Upper Leg Assessment: Performed By: Physician Bonnell Publicoulter, Tiasia Weberg, NP Debridement: Debridement Pre-procedure Yes - 08:57 Verification/Time Out Taken: Start Time: 08:58 Pain Control: Other : lidocaine 4% Level: Skin/Subcutaneous Tissue Total Area Debrided (L x 0.5 (cm) x 0.4 (cm) = 0.2 (cm) W): Tissue and other Viable, Non-Viable, Fat, Subcutaneous material debrided: Instrument: Blade Bleeding: Minimum Hemostasis Achieved: Pressure End Time: 09:00 Procedural Pain: 0 Post Procedural Pain: 0 Response to Treatment: Procedure was tolerated well Post Debridement Measurements of Total Wound Length: (cm) 0.5 Width: (cm) 0.5 Depth: (cm) 0.5 Volume: (cm)  0.098 Character of Wound/Ulcer Post Requires Further Debridement Debridement: Severity of Tissue Post Debridement: Fat layer exposed Post Procedure Diagnosis Same as Pre-procedure Electronic Signature(s) Signed: 06/19/2016 2:15:13 AM By: Bonnell Publicoulter, Jesalyn Finazzo Signed: 06/19/2016 5:37:23 PM By: Mayo Aoorthy, Joanna Covino, Dhwani T. ([id number]) Entered By: Bonnell Publicoulter, Orphia Mctigue on 06/19/2016 02:15:13 Joy MaineMISE, Joy T. ([id number]) -------------------------------------------------------------------------------- HPI Details Patient Name: Joy Sanchez, Hally T. 06/18/2016 8:00 Date of Service: AM Medical Record [id number] Number: Patient Account Number: [id number] [birth year] (81 y.o. Treating RN: Curtis Sitesorthy, Joanna Date of Birth/Sex: Female) Other Clinician: Primary Care Physician: Cleda ClarksWalker III, John Treating Bonnetta Allbee Referring Physician: Jodi MourningWalker III, John Physician/Extender: Tania AdeWeeks in Treatment: 0 History of Present Illness HPI Description: Ms. Ferdinand CavaMise is a 81 year old female who arrives today for management of a wound to her right inner thigh. She sought treatment at Urgent care on 06/08/2016 for an abscess at which time it was incised and drained, cultured, packed with iodoform gauze and she was provided a prescription for Bactrim. She returned to the urgent care on 10/30 for a recheck. On 06/10/2016 she presented to the E.R for c/o weakness associated with the Bactrim, thought to be a reaction to the antibiotic. She was given a fluid challenge at which time she admitted to feeling better; she was instructed to stop the Bactrim. She saw her PCP, Dr. Dan HumphreysWalker on 11/2 at which time she was prescribed doxycycline for MRSA per culture report. At that appointment a referral was made to the wound care center for definitive management. She presents to the clinic today with an ulcer to her right inner thigh. She is tolerating the doxycycline as prescribed and has 4 days left in this prescription. She has been using peroxide and bandages  for wound care per her PCP direction. She denies any pain at rest, admits to some irritation with ambulation as she has friction with ambulation. She has been changing dressing daily. She denies any urinary soiling onto dressing. Electronic Signature(s) Signed: 06/19/2016 2:18:48 AM By: Bonnell Publicoulter, Shivonne Schwartzman Entered By: Bonnell Publicoulter, Jeremias Broyhill on 06/19/2016  02:18:48 Joy MaineMISE, Nichoel T. ([id number]) -------------------------------------------------------------------------------- Physical Exam Details Patient Name: Joy MaineMISE, Joy T. 06/18/2016 8:00 Date of Service: AM Medical Record [id number] Number: Patient Account Number: [id number] 1935/04/13 (81 y.o. Treating RN: Curtis Sitesorthy, Joanna Date of Birth/Sex: Female) Other Clinician: Primary Care Physician: Cleda ClarksWalker III, John Treating Rianna Lukes Referring Physician: Jodi MourningWalker III, John Physician/Extender: Tania AdeWeeks in Treatment: 0 Constitutional BP within normal limits. afebrile. well nourished; well developed; appears stated age;Marland Kitchen. Respiratory non-labored respiratory effort. clear to all fields. Cardiovascular S1 S2 with regular rate and rhythm. Musculoskeletal ambulated without assistance; steady gait. Integumentary (Hair, Skin) erythema to peri-wound, less than 1 cm circumferential; abscess site with no active drainage, no purulence with palpation, necrotic tissue present in ulcer. induration, no fluctuance. Psychiatric appears to make sound judgement and have accurate insight regarding healthcare. oriented to time, place, person and situation. calm, pleasant, conversive. Notes patient admits that she is unable to visualize and care for wound independently, admits to having someone available for assistance Electronic Signature(s) Signed: 06/19/2016 2:23:11 AM By: Bonnell Publicoulter, Arneshia Ade Entered By: Bonnell Publicoulter, Kyrah Schiro on 06/19/2016 02:23:11 Joy MaineMISE, Sonyia T. ([id number]) -------------------------------------------------------------------------------- Physician Orders Details Patient  Name: Joy Sanchez, Tabytha T. 06/18/2016 8:00 Date of Service: AM Medical Record [id number] Number: Patient Account Number: [id number] 1935/04/13 (80 y.o. Treating RN: Huel CoventryWoody, Kim Date of Birth/Sex: Female) Other Clinician: Primary Care Physician: Cleda ClarksWalker III, John Treating Avantae Bither Referring Physician: Jodi MourningWalker III, John Physician/Extender: Tania AdeWeeks in Treatment: 0 Verbal / Phone Orders: Yes Clinician: Huel CoventryWoody, Kim Read Back and Verified: Yes Diagnosis Coding Wound Cleansing Wound #1 Right,Medial Upper Leg o Clean wound with Normal Saline. Anesthetic Wound #1 Right,Medial Upper Leg o Topical Lidocaine 4% cream applied to wound bed prior to debridement Primary Wound Dressing Wound #1 Right,Medial Upper Leg o Other: - plain packing strip 1/8 inch tucked slightly into wound. Secondary Dressing Wound #1 Right,Medial Upper Leg o Boardered Foam Dressing Dressing Change Frequency Wound #1 Right,Medial Upper Leg o Change dressing every day. Follow-up Appointments Wound #1 Right,Medial Upper Leg o Return Appointment in 1 week. Additional Orders / Instructions Wound #1 Right,Medial Upper Leg o Other: - Continue Antibiotics Electronic Signature(s) Signed: 06/19/2016 2:25:42 AM By: Bonnell Publicoulter, Eustace Hur Signed: 06/19/2016 5:39:47 PM By: Elliot GurneyWoody, RN, BSN, Kim RN, BSN Birenbaum, Destinee Marland Kitchen. ([id number]) Entered By: Elliot GurneyWoody, RN, BSN, Kim on 06/18/2016 09:55:11 Leys, Kate SableFAYE T. ([id number]) -------------------------------------------------------------------------------- Problem List Details Patient Name: Joy MaineMISE, Joy T. 06/18/2016 8:00 Date of Service: AM Medical Record [id number] Number: Patient Account Number: [id number] 1935/04/13 (80 y.o. Treating RN: Curtis Sitesorthy, Joanna Date of Birth/Sex: Female) Other Clinician: Primary Care Physician: Cleda ClarksWalker III, John Treating Jatavia Keltner Referring Physician: Jodi MourningWalker III, John Physician/Extender: Tania AdeWeeks in Treatment: 0 Active Problems ICD-10 Encounter Code  Description Active Date Diagnosis L02.415 Cutaneous abscess of right lower limb 06/19/2016 Yes E08.622 Diabetes mellitus due to underlying condition with other 06/19/2016 Yes skin ulcer E11.9 Type 2 diabetes mellitus without complications 06/19/2016 Yes Inactive Problems Resolved Problems Electronic Signature(s) Signed: 06/19/2016 2:14:57 AM By: Bonnell Publicoulter, Milayah Krell Entered By: Bonnell Publicoulter, Lenon Kuennen on 06/19/2016 02:14:57 Herrin, Kate SableFAYE T. ([id number]) -------------------------------------------------------------------------------- Progress Note Details Patient Name: Joy Sanchez, Joy T. 06/18/2016 8:00 Date of Service: AM Medical Record [id number] Number: Patient Account Number: [id number] 1935/04/13 (80 y.o. Treating RN: Curtis Sitesorthy, Joanna Date of Birth/Sex: Female) Other Clinician: Primary Care Physician: Cleda ClarksWalker III, John Treating Elliot Meldrum Referring Physician: Jodi MourningWalker III, John Physician/Extender: Tania AdeWeeks in Treatment: 0 Subjective Chief Complaint Information obtained from Patient Ms. Offutt arrives for evaluation of right inner thigh abscess History of Present Illness (HPI) Ms. Ferdinand CavaMise is a 81 year  old female who arrives today for management of a wound to her right inner thigh. She sought treatment at Urgent care on 06/08/2016 for an abscess at which time it was incised and drained, cultured, packed with iodoform gauze and she was provided a prescription for Bactrim. She returned to the urgent care on 10/30 for a recheck. On 06/10/2016 she presented to the E.R for c/o weakness associated with the Bactrim, thought to be a reaction to the antibiotic. She was given a fluid challenge at which time she admitted to feeling better; she was instructed to stop the Bactrim. She saw her PCP, Dr. Dan Humphreys on 11/2 at which time she was prescribed doxycycline for MRSA per culture report. At that appointment a referral was made to the wound care center for definitive management. She presents to the clinic today with an ulcer to  her right inner thigh. She is tolerating the doxycycline as prescribed and has 4 days left in this prescription. She has been using peroxide and bandages for wound care per her PCP direction. She denies any pain at rest, admits to some irritation with ambulation as she has friction with ambulation. She has been changing dressing daily. She denies any urinary soiling onto dressing. Wound History Patient presents with 1 open wound that has been present for approximately 2 weeks. Patient has been treating wound in the following manner: peroxide and bandage. Laboratory tests have not been performed in the last month. Patient reportedly has not tested positive for an antibiotic resistant organism. Patient reportedly has not tested positive for osteomyelitis. Patient History Information obtained from Patient, Chart. Allergies codeine, Iodinated Contrast- Oral and IV Dye, Bactrim Family History Cancer - Mother, Diabetes - Mother, Siblings, Heart Disease - Father, Siblings, Hypertension - Siblings, Mother, Stroke - Maternal Grandparents, Mother, No family history of Kidney Disease, Lung Disease, Seizures, Thyroid Problems, Tuberculosis. JACQUELEEN, PULVER ([id number]) Social History Never smoker, Marital Status - Widowed, Alcohol Use - Never, Drug Use - No History, Caffeine Use - Moderate - soft drinks, Tea. Medical History Eyes Patient has history of Cataracts - not ready for removal Denies history of Glaucoma, Optic Neuritis Ear/Nose/Mouth/Throat Denies history of Chronic sinus problems/congestion, Middle ear problems Hematologic/Lymphatic Denies history of Anemia, Hemophilia, Human Immunodeficiency Virus, Lymphedema, Sickle Cell Disease Respiratory Denies history of Aspiration, Asthma, Chronic Obstructive Pulmonary Disease (COPD), Pneumothorax, Sleep Apnea, Tuberculosis Cardiovascular Patient has history of Hypertension Denies history of Angina, Arrhythmia, Congestive Heart Failure, Coronary  Artery Disease, Deep Vein Thrombosis, Hypotension, Myocardial Infarction, Peripheral Arterial Disease, Peripheral Venous Disease, Phlebitis, Vasculitis Gastrointestinal Denies history of Cirrhosis , Colitis, Crohn s, Hepatitis A, Hepatitis B, Hepatitis C Endocrine Patient has history of Type II Diabetes - Diet Controlled Denies history of Type I Diabetes Genitourinary Denies history of End Stage Renal Disease Immunological Denies history of Lupus Erythematosus, Raynaud s, Scleroderma Integumentary (Skin) Denies history of History of Burn, History of pressure wounds Musculoskeletal Patient has history of Osteoarthritis Denies history of Gout, Rheumatoid Arthritis, Osteomyelitis Neurologic Denies history of Dementia, Neuropathy, Quadriplegia, Seizure Disorder Oncologic Denies history of Received Chemotherapy, Received Radiation Psychiatric Denies history of Anorexia/bulimia, Confinement Anxiety Patient is treated with Controlled Diet. Blood sugar is not tested. Hospitalization/Surgery History - bilateral knee replacement. - Appendectomy. - Hysterectomy. 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 ([id number]) Review of Systems (ROS) Constitutional Symptoms (General Health) The patient has no complaints or symptoms. Eyes  The patient has no complaints or symptoms. Ear/Nose/Mouth/Throat The patient has no complaints or symptoms. Hematologic/Lymphatic The patient has no complaints or symptoms. Respiratory The patient has no complaints or symptoms. Cardiovascular The patient has no complaints or symptoms. Gastrointestinal The patient has no complaints or symptoms. Endocrine The patient has no complaints or symptoms. Genitourinary Complains or has symptoms of Incontinence/dribbling -  urine. Denies complaints or symptoms of Kidney failure/ Dialysis. Immunological The patient has no complaints or symptoms. Integumentary (Skin) Complains or has symptoms of Wounds, Bleeding or bruising tendency. Denies complaints or symptoms of Breakdown, Swelling. Musculoskeletal The patient has no complaints or symptoms. Neurologic The patient has no complaints or symptoms. Oncologic The patient has no complaints or symptoms. Psychiatric The patient has no complaints or symptoms. Objective Constitutional BP within normal limits. afebrile. well nourished; well developed; appears stated age;Marland Kitchen Vitals Time Taken: 8:07 AM, Height: 62 in, Weight: 201 lbs, BMI: 36.8, Temperature: 97.5 F, Pulse: 74 bpm, Respiratory Rate: 16 breaths/min, Blood Pressure: 143/72 mmHg. Respiratory Esterly, Senaya T. ([id number]) non-labored respiratory effort. clear to all fields. Cardiovascular S1 S2 with regular rate and rhythm. Musculoskeletal ambulated without assistance; steady gait. Psychiatric appears to make sound judgement and have accurate insight regarding healthcare. oriented to time, place, person and situation. calm, pleasant, conversive. General Notes: patient admits that she is unable to visualize and care for wound independently, admits to having someone available for assistance Integumentary (Hair, Skin) erythema to peri-wound, less than 1 cm circumferential; abscess site with no active drainage, no purulence with palpation, necrotic tissue present in ulcer. induration, no fluctuance. Wound #1 status is Open. Original cause of wound was Gradually Appeared. The wound is located on the Right,Medial Upper Leg. The wound measures 0.5cm length x 0.4cm width x 0.2cm depth; 0.157cm^2 area and 0.031cm^3 volume. There is no tunneling noted, however, there is undermining starting at 8:00 and ending at 12:00 with a maximum distance of 0.3cm. There is a medium amount of serosanguineous drainage noted.  The wound margin is flat and intact. There is no granulation within the wound bed. There is a small (1-33%) amount of necrotic tissue within the wound bed including Eschar. The periwound skin appearance exhibited: Induration, Moist, Ecchymosis. The periwound skin appearance did not exhibit: Callus, Crepitus, Excoriation, Fluctuance, Friable, Localized Edema, Rash, Scarring, Dry/Scaly, Maceration, Atrophie Blanche, Cyanosis, Hemosiderin Staining, Mottled, Pallor, Rubor, Erythema. Periwound temperature was noted as No Abnormality. The periwound has tenderness on palpation. Assessment Active Problems ICD-10 L02.415 - Cutaneous abscess of right lower limb E08.622 - Diabetes mellitus due to underlying condition with other skin ulcer E11.9 - Type 2 diabetes mellitus without complications Procedures Gaeta, Rmoni T. ([id number]) Wound #1 Wound #1 is a Diabetic Wound/Ulcer of the Lower Extremity located on the Right,Medial Upper Leg . There was a Skin/Subcutaneous Tissue Debridement (52841-32440) debridement with total area of 0.2 sq cm performed by Bonnell Public, NP. with the following instrument(s): Blade to remove Viable and Non-Viable tissue/material including Fat and Subcutaneous after achieving pain control using Other (lidocaine 4%). A time out was conducted at 08:57, prior to the start of the procedure. A Minimum amount of bleeding was controlled with Pressure. The procedure was tolerated well with a pain level of 0 throughout and a pain level of 0 following the procedure. Post Debridement Measurements: 0.5cm length x 0.5cm width x 0.5cm depth; 0.098cm^3 volume. Character of Wound/Ulcer Post Debridement requires further debridement. Severity of Tissue Post Debridement is: Fat layer exposed. Post procedure Diagnosis Wound #1: Same as Pre-Procedure  Plan Wound Cleansing: Wound #1 Right,Medial Upper Leg: Clean wound with Normal Saline. Anesthetic: Wound #1 Right,Medial Upper Leg: Topical  Lidocaine 4% cream applied to wound bed prior to debridement Primary Wound Dressing: Wound #1 Right,Medial Upper Leg: Other: - plain packing strip 1/8 inch tucked slightly into wound. Secondary Dressing: Wound #1 Right,Medial Upper Leg: Boardered Foam Dressing Dressing Change Frequency: Wound #1 Right,Medial Upper Leg: Change dressing every day. Follow-up Appointments: Wound #1 Right,Medial Upper Leg: Return Appointment in 1 week. Additional Orders / Instructions: Wound #1 Right,Medial Upper Leg: Other: - Continue Antibiotics Follow-Up Appointments: A follow-up appointment should be scheduled. Medication Reconciliation completed and provided to Patient/Care Provider. A Patient Clinical Summary of Care was provided to [facility] Waight, Shellby T. ([id number]) 1. minimize friction to right thigh, maintain dressing in place 2. routine follow up for wound management Electronic Signature(s) Signed: 06/19/2016 2:24:18 AM By: Bonnell Public Entered By: Bonnell Public on 06/19/2016 02:24:18 Joy Sanchez ([id number]) -------------------------------------------------------------------------------- ROS/PFSH Details Patient Name: Joy Nakai T. 06/18/2016 8:00 Date of Service: AM Medical Record [id number] Number: Patient Account Number: [id number] April 24, 1935 (80 y.o. Treating RN: Huel Coventry Date of Birth/Sex: Female) Other Clinician: Primary Care Physician: Cleda Clarks, Anusha Claus Referring Physician: Jodi Mourning Physician/Extender: Tania Ade in Treatment: 0 Information Obtained From Patient Chart Wound History Do you currently have one or more open woundso Yes How many open wounds do you currently haveo 1 Approximately how long have you had your woundso 2 weeks How have you been treating your wound(s) until nowo peroxide and bandage Has your wound(s) ever healed and then re-openedo No Have you had any lab work done in the past montho No Have you tested positive for an  antibiotic resistant organism (MRSA, VRE)o No Have you tested positive for osteomyelitis (bone infection)o No Genitourinary Complaints and Symptoms: Positive for: Incontinence/dribbling - urine Negative for: Kidney failure/ Dialysis Medical History: Negative for: End Stage Renal Disease Integumentary (Skin) Complaints and Symptoms: Positive for: Wounds; Bleeding or bruising tendency Negative for: Breakdown; Swelling Medical History: Negative for: History of Burn; History of pressure wounds Constitutional Symptoms (General Health) Complaints and Symptoms: No Complaints or Symptoms Medical History: Past Medical History Notes: High Cholesterol; HTN; Osteoporosis; bilateral knee replacements; hysterectomy; appendectomy Eyes Cawley, Kadajah T. ([id number]) Complaints and Symptoms: No Complaints or Symptoms Medical History: Positive for: Cataracts - not ready for removal Negative for: Glaucoma; Optic Neuritis Ear/Nose/Mouth/Throat Complaints and Symptoms: No Complaints or Symptoms Medical History: Negative for: Chronic sinus problems/congestion; Middle ear problems Hematologic/Lymphatic Complaints and Symptoms: No Complaints or Symptoms Medical History: Negative for: Anemia; Hemophilia; Human Immunodeficiency Virus; Lymphedema; Sickle Cell Disease Respiratory Complaints and Symptoms: No Complaints or Symptoms Medical History: Negative for: Aspiration; Asthma; Chronic Obstructive Pulmonary Disease (COPD); Pneumothorax; Sleep Apnea; Tuberculosis Cardiovascular Complaints and Symptoms: No Complaints or Symptoms Medical History: Positive for: Hypertension Negative for: Angina; Arrhythmia; Congestive Heart Failure; Coronary Artery Disease; Deep Vein Thrombosis; Hypotension; Myocardial Infarction; Peripheral Arterial Disease; Peripheral Venous Disease; Phlebitis; Vasculitis Gastrointestinal Complaints and Symptoms: No Complaints or Symptoms Medical History: Negative for:  Cirrhosis ; Colitis; Crohnos; Hepatitis A; Hepatitis B; Hepatitis C Kercheval, Maylen T. ([id number]) Endocrine Complaints and Symptoms: No Complaints or Symptoms Medical History: Positive for: Type II Diabetes - Diet Controlled Negative for: Type I Diabetes Time with diabetes: 10 years Treated with: Diet Blood sugar tested every day: No Immunological Complaints and Symptoms: No Complaints or Symptoms Medical History: Negative for: Lupus Erythematosus; Raynaudos; Scleroderma Musculoskeletal Complaints and Symptoms: No Complaints or Symptoms Medical History:  Positive for: Osteoarthritis Negative for: Gout; Rheumatoid Arthritis; Osteomyelitis Neurologic Complaints and Symptoms: No Complaints or Symptoms Medical History: Negative for: Dementia; Neuropathy; Quadriplegia; Seizure Disorder Oncologic Complaints and Symptoms: No Complaints or Symptoms Medical History: Negative for: Received Chemotherapy; Received Radiation Past Medical History Notes: 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 ([id number]) Medical History: Negative for: Anorexia/bulimia; Confinement Anxiety HBO Extended History Items Eyes: Cataracts Immunizations Pneumococcal Vaccine: Received Pneumococcal Vaccination: Yes Hospitalization / Surgery History Name of Hospital Purpose of Hospitalization/Surgery Date bilateral knee replacement Appendectomy Hysterectomy Family and Social History Cancer: Yes - Mother; Diabetes: Yes - Mother, Siblings; Heart Disease: Yes - Father, Siblings; Hypertension: Yes - Siblings, Mother; Kidney Disease: No; Lung Disease: No; Seizures: No; Stroke: Yes - Maternal Grandparents, Mother; Thyroid Problems: No; Tuberculosis: No; Never smoker; Marital Status - Widowed; Alcohol Use: Never; Drug Use: No History; Caffeine Use: Moderate - soft drinks, Tea; Advanced Directives: No; Patient does not want information on  Advanced Directives; Do not resuscitate: No; Living Will: No; Medical Power of Attorney: No Electronic Signature(s) Signed: 06/19/2016 2:25:42 AM By: Bonnell Public Signed: 06/19/2016 5:39:47 PM By: Elliot Gurney, RN, BSN, Kim RN, BSN Entered By: Elliot Gurney, RN, BSN, Kim on 06/18/2016 08:25:47 Hinckley, Kate Sable ([id number]) -------------------------------------------------------------------------------- SuperBill Details Patient Name: Joy Nakai T. Date of Service: 06/18/2016 Medical Record Number: [id number] Patient Account Number: [id number] Date of Birth/Sex: 1935/01/23 (80 y.o. 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 E11.9 Type 2 diabetes mellitus without complications Facility Procedures CPT4 Code Description: 08657846 99213 - WOUND CARE VISIT-LEV 3 EST PT Modifier: Quantity: 1 CPT4 Code Description: [id number] 11042 - DEB SUBQ TISSUE 20 SQ CM/< ICD-10 Description Diagnosis L02.415 Cutaneous abscess of right lower limb E08.622 Diabetes mellitus due to underlying condition with oth E11.9 Type 2 diabetes mellitus without  complications Modifier: er skin ulc Quantity: 1 er Physician Procedures CPT4 Code Description: 1324401 11042 - WC PHYS SUBQ TISS 20 SQ CM ICD-10 Description Diagnosis L02.415 Cutaneous abscess of right lower limb E08.622 Diabetes mellitus due to underlying condition with oth E11.9 Type 2 diabetes mellitus without  complications Modifier: er skin ulc Quantity: 1 er Electronic Signature(s) Signed: 06/19/2016 2:24:40 AM By: Bonnell Public Entered By: Bonnell Public on 06/19/2016 02:24:40

## 2016-06-20 NOTE — Progress Notes (Signed)
Joy Sanchez, Billiejean T. ([id number]) Visit Report for 06/18/2016 Abuse/Suicide Risk Screen Details Patient Name: Joy Sanchez, Joy T. 06/18/2016 8:00 Date of Service: AM Medical Record [id number] Number: Patient Account Number: [id number] [birth year] (81 y.o. Treating RN: Huel CoventryWoody, Kim Date of Birth/Sex: Female) Other Clinician: Primary Care Physician: Cleda ClarksWalker III, John Treating Coulter, Leah Referring Physician: Jodi MourningWalker III, John Physician/Extender: Tania AdeWeeks in Treatment: 0 Abuse/Suicide Risk Screen Items Answer ABUSE/SUICIDE RISK SCREEN: Has anyone close to you tried to hurt or harm you recentlyo No Do you feel uncomfortable with anyone in your familyo No Has anyone forced you do things that you didnot want to doo No Do you have any thoughts of harming yourselfo No Patient displays signs or symptoms of abuse and/or neglect. No Electronic Signature(s) Signed: 06/19/2016 5:39:47 PM By: Elliot GurneyWoody, RN, BSN, Kim RN, BSN Entered By: Elliot GurneyWoody, RN, BSN, Kim on 06/18/2016 08:26:11 Grisso, Kate SableFAYE T. ([id number]) -------------------------------------------------------------------------------- Activities of Daily Living Details Patient Name: Joy Sanchez, Joy T. 06/18/2016 8:00 Date of Service: AM Medical Record [id number] Number: Patient Account Number: [id number] [birth year] (80 y.o. Treating RN: Huel CoventryWoody, Kim Date of Birth/Sex: Female) Other Clinician: Primary Care Physician: Cleda ClarksWalker III, John Treating Coulter, Leah Referring Physician: Jodi MourningWalker III, John Physician/Extender: Tania AdeWeeks in Treatment: 0 Activities of Daily Living Items Answer Activities of Daily Living (Please select one for each item) Drive Automobile Completely Able Take Medications Completely Able Use Telephone Completely Able Care for Appearance Completely Able Use Toilet Completely Able Bath / Shower Completely Able Dress Self Completely Able Feed Self Completely Able Walk Completely Able Get In / Out Bed Completely Able Housework Completely Able Prepare  Meals Completely Able Handle Money Completely Able Shop for Self Completely Able Electronic Signature(s) Signed: 06/19/2016 5:39:47 PM By: Elliot GurneyWoody, RN, BSN, Kim RN, BSN Entered By: Elliot GurneyWoody, RN, BSN, Kim on 06/18/2016 08:26:31 Roddey, Kate SableFAYE T. ([id number]) -------------------------------------------------------------------------------- Education Assessment Details Patient Name: Joy Sanchez, Joy T. 06/18/2016 8:00 Date of Service: AM Medical Record [id number] Number: Patient Account Number: [id number] [birth year] (80 y.o. Treating RN: Huel CoventryWoody, Kim Date of Birth/Sex: Female) Other Clinician: Primary Care Physician: Cleda ClarksWalker III, John Treating Coulter, Leah Referring Physician: Jodi MourningWalker III, John Physician/Extender: Tania AdeWeeks in Treatment: 0 Primary Learner Assessed: Patient Learning Preferences/Education Level/Primary Language Learning Preference: Explanation Highest Education Level: High School Preferred Language: English Cognitive Barrier Assessment/Beliefs Language Barrier: No Translator Needed: No Memory Deficit: No Emotional Barrier: No Cultural/Religious Beliefs Affecting Medical No Care: Physical Barrier Assessment Impaired Vision: Yes Decreased Hand dexterity: No Knowledge/Comprehension Assessment Knowledge Level: High Comprehension Level: High Ability to understand written High instructions: Ability to understand verbal High instructions: Motivation Assessment Anxiety Level: Calm Cooperation: Cooperative Education Importance: Acknowledges Need Interest in Health Problems: Asks Questions Perception: Coherent Willingness to Engage in Self- High Management Activities: Readiness to Engage in Self- High Management Activities: Joy Sanchez, Joy T. ([id number]) Electronic Signature(s) Signed: 06/19/2016 5:39:47 PM By: Elliot GurneyWoody, RN, BSN, Kim RN, BSN Entered By: Elliot GurneyWoody, RN, BSN, Kim on 06/18/2016 84:16:6008:27:28 Joy Sanchez, Joy T.  ([id number]) -------------------------------------------------------------------------------- Fall Risk Assessment Details Patient Name: Joy Sanchez, Joy T. 06/18/2016 8:00 Date of Service: AM Medical Record [id number] Number: Patient Account Number: [id number] [birth year] (80 y.o. Treating RN: Huel CoventryWoody, Kim Date of Birth/Sex: Female) Other Clinician: Primary Care Physician: Cleda ClarksWalker III, John Treating Coulter, Leah Referring Physician: Jodi MourningWalker III, John Physician/Extender: Tania AdeWeeks in Treatment: 0 Fall Risk Assessment Items Have you had 2 or more falls in the last 12 monthso 0 No Have you had any fall that resulted in injury in the last 12 monthso 0 No FALL RISK ASSESSMENT: History of falling -  immediate or within 3 months 0 No Secondary diagnosis 0 No Ambulatory aid None/bed rest/wheelchair/nurse 0 Yes Crutches/cane/walker 0 No Furniture 0 No IV Access/Saline Lock 0 No Gait/Training Normal/bed rest/immobile 0 Yes Weak 0 No Impaired 0 No Mental Status Oriented to own ability 0 Yes Electronic Signature(s) Signed: 06/19/2016 5:39:47 PM By: Elliot GurneyWoody, RN, BSN, Kim RN, BSN Entered By: Elliot GurneyWoody, RN, BSN, Kim on 06/18/2016 08:27:47 [place], Kate SableFAYE T. ([id number]) -------------------------------------------------------------------------------- Nutrition Risk Assessment Details Patient Name: Joy Sanchez, Joy T. 06/18/2016 8:00 Date of Service: AM Medical Record [id number] Number: Patient Account Number: [id number] [birth year] (80 y.o. Treating RN: Huel CoventryWoody, Kim Date of Birth/Sex: Female) Other Clinician: Primary Care Physician: Cleda ClarksWalker III, John Treating Coulter, Leah Referring Physician: Jodi MourningWalker III, John Physician/Extender: Tania AdeWeeks in Treatment: 0 Height (in): 62 Weight (lbs): 201 Body Mass Index (BMI): 36.8 Nutrition Risk Assessment Items NUTRITION RISK SCREEN: I have an illness or condition that made me change the kind and/or 0 No amount of food I eat I eat fewer than two meals per day 0 No I eat few  fruits and vegetables, or milk products 0 No I have three or more drinks of beer, liquor or wine almost every day 0 No I have tooth or mouth problems that make it hard for me to eat 0 No I don't always have enough money to buy the food I need 0 No I eat alone most of the time 1 Yes I take three or more different prescribed or over-the-counter drugs a 0 No day Without wanting to, I have lost or gained 10 pounds in the last six 2 Yes months I am not always physically able to shop, cook and/or feed myself 0 No Nutrition Protocols Good Risk Protocol Provide education on elevated blood sugars Moderate Risk Protocol 0 and impact on wound healing, as applicable Electronic Signature(s) Signed: 06/19/2016 5:39:47 PM By: Elliot GurneyWoody, RN, BSN, Kim RN, BSN Entered By: Elliot GurneyWoody, RN, BSN, Kim on 06/18/2016 78:29:5608:28:27

## 2016-06-25 ENCOUNTER — Encounter: Payer: Medicare Other | Admitting: Internal Medicine

## 2016-06-25 DIAGNOSIS — L02415 Cutaneous abscess of right lower limb: Secondary | ICD-10-CM | POA: Diagnosis not present

## 2016-06-26 NOTE — Progress Notes (Signed)
Joy Sanchez, Joy T. ([id number]) Visit Report for 06/25/2016 Arrival Information Details Patient Name: Joy Sanchez, Joy T. Date of Service: 06/25/2016 10:00 AM Medical Record Patient Account Number: [id number] Number: Treating RN: Curtis SitesDorthy, Joanna 10-06-1934 (80 y.o. Other Clinician: Date of Birth/Sex: Female) Treating ROBSON, MICHAEL Primary Care Physician/Extender: Rubie MaidG Walker III, John Physician: Referring Physician: Elvera MariaWalker III, John Weeks in Treatment: 1 Visit Information History Since Last Visit Added or deleted any medications: No Patient Arrived: Ambulatory Any new allergies or adverse reactions: No Arrival Time: 10:19 Had a fall or experienced change in No Accompanied By: self activities of daily living that may affect Transfer Assistance: None risk of falls: Patient Identification Verified: Yes Signs or symptoms of abuse/neglect since last No Secondary Verification Process Yes visito Completed: Hospitalized since last visit: No Patient Has Alerts: Yes Pain Present Now: No Patient Alerts: Patient on Blood Thinner 81mg  asprirn DM II-Diet Controlled 9/17 A1C 6.4 Electronic Signature(s) Signed: 06/25/2016 1:55:43 PM By: Curtis Sitesorthy, Joanna Entered By: Curtis Sitesorthy, Joanna on 06/25/2016 10:23:11 Breaker, Joy SableFAYE T. ([id number]) -------------------------------------------------------------------------------- Clinic Level of Care Assessment Details Patient Name: Joy Sanchez, Joy T. Date of Service: 06/25/2016 10:00 AM Medical Record Patient Account Number: [id number] Number: Treating RN: Curtis SitesDorthy, Joanna 10-06-1934 (80 y.o. Other Clinician: Date of Birth/Sex: Female) Treating ROBSON, MICHAEL Primary Care Physician/Extender: Rubie MaidG Walker III, John Physician: Referring Physician: Elvera MariaWalker III, John Weeks in Treatment: 1 Clinic Level of Care Assessment Items TOOL 4 Quantity Score []  - Use when only an EandM is performed on FOLLOW-UP visit 0 ASSESSMENTS - Nursing Assessment /  Reassessment X - Reassessment of Co-morbidities (includes updates in patient status) 1 10 X - Reassessment of Adherence to Treatment Plan 1 5 ASSESSMENTS - Wound and Skin Assessment / Reassessment X - Simple Wound Assessment / Reassessment - one wound 1 5 []  - Complex Wound Assessment / Reassessment - multiple wounds 0 []  - Dermatologic / Skin Assessment (not related to wound area) 0 ASSESSMENTS - Focused Assessment []  - Circumferential Edema Measurements - multi extremities 0 []  - Nutritional Assessment / Counseling / Intervention 0 []  - Lower Extremity Assessment (monofilament, tuning fork, pulses) 0 []  - Peripheral Arterial Disease Assessment (using hand held doppler) 0 ASSESSMENTS - Ostomy and/or Continence Assessment and Care []  - Incontinence Assessment and Management 0 []  - Ostomy Care Assessment and Management (repouching, etc.) 0 PROCESS - Coordination of Care X - Simple Patient / Family Education for ongoing care 1 15 []  - Complex (extensive) Patient / Family Education for ongoing care 0 []  - Staff obtains Consents, Records, Test Results / Process Orders 0 Joy Sanchez, Joy SableFAYE T. ([id number]) []  - Staff telephones HHA, Nursing Homes / Clarify orders / etc 0 []  - Routine Transfer to another Facility (non-emergent condition) 0 []  - Routine Hospital Admission (non-emergent condition) 0 []  - New Admissions / [employer] Authorizations / Ordering NPWT, Apligraf, etc. 0 []  - Emergency Hospital Admission (emergent condition) 0 X - Simple Discharge Coordination 1 10 []  - Complex (extensive) Discharge Coordination 0 PROCESS - Special Needs []  - Pediatric / Minor Patient Management 0 []  - Isolation Patient Management 0 []  - Hearing / Language / Visual special needs 0 []  - Assessment of Community assistance (transportation, D/C planning, etc.) 0 []  - Additional assistance / Altered mentation 0 []  - Support Surface(s) Assessment (bed, cushion, seat, etc.) 0 INTERVENTIONS - Wound Cleansing /  Measurement X - Simple Wound Cleansing - one wound 1 5 []  - Complex Wound Cleansing - multiple wounds 0 X - Wound Imaging (photographs - any  number of wounds) 1 5 []  - Wound Tracing (instead of photographs) 0 X - Simple Wound Measurement - one wound 1 5 []  - Complex Wound Measurement - multiple wounds 0 INTERVENTIONS - Wound Dressings X - Small Wound Dressing one or multiple wounds 1 10 []  - Medium Wound Dressing one or multiple wounds 0 []  - Large Wound Dressing one or multiple wounds 0 X - Application of Medications - topical 1 5 []  - Application of Medications - injection 0 Joy Sanchez, Joy T. ([id number]) INTERVENTIONS - Miscellaneous []  - External ear exam 0 []  - Specimen Collection (cultures, biopsies, blood, body fluids, etc.) 0 []  - Specimen(s) / Culture(s) sent or taken to Lab for analysis 0 []  - Patient Transfer (multiple staff / Michiel Sites Lift / Similar devices) 0 []  - Simple Staple / Suture removal (25 or less) 0 []  - Complex Staple / Suture removal (26 or more) 0 []  - Hypo / Hyperglycemic Management (close monitor of Blood Glucose) 0 []  - Ankle / Brachial Index (ABI) - do not check if billed separately 0 X - Vital Signs 1 5 Has the patient been seen at the hospital within the last three years: Yes Total Score: 80 Level Of Care: New/Established - Level 3 Electronic Signature(s) Signed: 06/25/2016 1:55:43 PM By: Curtis Sites Entered By: Curtis Sites on 06/25/2016 10:45:04 Joy Sanchez, Joy Sanchez ([id number]) -------------------------------------------------------------------------------- Encounter Discharge Information Details Patient Name: Joy Nakai T. Date of Service: 06/25/2016 10:00 AM Medical Record Patient Account Number: [id number] Number: Treating RN: Curtis Sites Nov 07, 1934 (80 y.o. Other Clinician: Date of Birth/Sex: Female) Treating ROBSON, MICHAEL Primary Care Physician/Extender: Rubie Maid, John Physician: Referring Physician: Elvera Maria  in Treatment: 1 Encounter Discharge Information Items Discharge Pain Level: 0 Discharge Condition: Stable Ambulatory Status: Ambulatory Discharge Destination: Home Transportation: Private Auto Accompanied By: self Schedule Follow-up Appointment: Yes Medication Reconciliation completed and provided to Patient/Care No Joy Sanchez: Provided on Clinical Summary of Care: 06/25/2016 Form Type Recipient Paper Patient FM Electronic Signature(s) Signed: 06/25/2016 10:54:35 AM By: Gwenlyn Perking Entered By: Gwenlyn Perking on 06/25/2016 10:54:34 Draeger, Joy Sanchez ([id number]) -------------------------------------------------------------------------------- Multi Wound Chart Details Patient Name: Joy Nakai T. Date of Service: 06/25/2016 10:00 AM Medical Record Patient Account Number: [id number] Number: Treating RN: Curtis Sites 1935-04-10 (80 y.o. Other Clinician: Date of Birth/Sex: Female) Treating ROBSON, MICHAEL Primary Care Physician/Extender: Rubie Maid, John Physician: Referring Physician: Elvera Maria in Treatment: 1 Vital Signs Height(in): 62 Pulse(bpm): 79 Weight(lbs): 201 Blood Pressure 148/60 (mmHg): Body Mass Index(BMI): 37 Temperature(F): 98.0 Respiratory Rate 16 (breaths/min): Photos: [N/A:N/A] Wound Location: Right Upper Leg - Medial N/A N/A Wounding Event: Gradually Appeared N/A N/A Primary Etiology: Diabetic Wound/Ulcer of N/A N/A the Lower Extremity Secondary Etiology: Abscess N/A N/A Comorbid History: Cataracts, Hypertension, N/A N/A Type II Diabetes, Osteoarthritis Date Acquired: 06/04/2016 N/A N/A Weeks of Treatment: 1 N/A N/A Wound Status: Open N/A N/A Measurements L x W x D 0.1x0.1x0.1 N/A N/A (cm) Area (cm) : 0.008 N/A N/A Volume (cm) : 0.001 N/A N/A % Reduction in Area: 94.90% N/A N/A % Reduction in Volume: 96.80% N/A N/A Classification: Grade 2 N/A N/A Exudate Amount: None Present N/A N/A Wound Margin: Flat and  Intact N/A N/A Hanzlik, Leon T. ([id number]) Granulation Amount: Large (67-100%) N/A N/A Granulation Quality: Pink N/A N/A Necrotic Amount: None Present (0%) N/A N/A Epithelialization: Large (67-100%) N/A N/A Periwound Skin Texture: Induration: Yes N/A N/A Edema: No Excoriation: No Callus: No Crepitus: No Fluctuance: No Friable: No Rash: No  Scarring: No Periwound Skin Moist: Yes N/A N/A Moisture: Maceration: No Dry/Scaly: No Periwound Skin Color: Ecchymosis: Yes N/A N/A Atrophie Blanche: No Cyanosis: No Erythema: No Hemosiderin Staining: No Mottled: No Pallor: No Rubor: No Temperature: No Abnormality N/A N/A Tenderness on Yes N/A N/A Palpation: Wound Preparation: Ulcer Cleansing: N/A N/A Rinsed/Irrigated with Saline Topical Anesthetic Applied: None Treatment Notes Electronic Signature(s) Signed: 06/25/2016 1:55:43 PM By: Curtis Sitesorthy, Joanna Entered By: Curtis Sitesorthy, Joanna on 06/25/2016 10:44:27 Pires, Joy SableFAYE T. ([id number]) -------------------------------------------------------------------------------- Multi-Disciplinary Care Plan Details Patient Name: Joy Sanchez, Joy T. Date of Service: 06/25/2016 10:00 AM Medical Record Patient Account Number: [id number] Number: Treating RN: Curtis SitesDorthy, Joanna [birth year] (80 y.o. Other Clinician: Date of Birth/Sex: Female) Treating ROBSON, MICHAEL Primary Care Physician/Extender: Rubie MaidG Walker III, John Physician: Referring Physician: Elvera MariaWalker III, John Weeks in Treatment: 1 Active Inactive Abuse / Safety / Falls / Self Care Management Nursing Diagnoses: Potential for falls Goals: Patient will remain injury free Date Initiated: 06/18/2016 Goal Status: Active Interventions: Assess fall risk on admission and as needed Notes: Medication Nursing Diagnoses: Knowledge deficit related to medication safety: actual or potential Goals: Patient/caregiver will demonstrate understanding of all current medications Date Initiated: 06/18/2016 Goal  Status: Active Interventions: Assess for medication contraindications each visit where new medications are prescribed Notes: Nutrition Nursing Diagnoses: Impaired glucose control: actual or potential Capriotti, Lenzi T. ([id number]) Goals: Patient/caregiver verbalizes understanding of need to maintain therapeutic glucose control per primary care physician Date Initiated: 06/18/2016 Goal Status: Active Interventions: Provide education on elevated blood sugars and impact on wound healing Notes: Orientation to the Wound Care Program Nursing Diagnoses: Knowledge deficit related to the wound healing center program Goals: Patient/caregiver will verbalize understanding of the Wound Healing Center Program Date Initiated: 06/18/2016 Goal Status: Active Interventions: Provide education on orientation to the wound center Notes: Wound/Skin Impairment Nursing Diagnoses: Impaired tissue integrity Goals: Ulcer/skin breakdown will heal within 14 weeks Date Initiated: 06/18/2016 Goal Status: Active Interventions: Assess patient/caregiver ability to obtain necessary supplies Notes: Electronic Signature(s) Signed: 06/25/2016 1:55:43 PM By: Curtis Sitesorthy, Joanna Entered By: Curtis Sitesorthy, Joanna on 06/25/2016 10:44:12 Joy Sanchez, Joy SableFAYE T. ([id number]) -------------------------------------------------------------------------------- Pain Assessment Details Patient Name: Joy Sanchez, Melisha T. Date of Service: 06/25/2016 10:00 AM Medical Record Patient Account Number: [id number] Number: Treating RN: Curtis SitesDorthy, Joanna [birth year] (80 y.o. Other Clinician: Date of Birth/Sex: Female) Treating ROBSON, MICHAEL Primary Care Physician/Extender: Rubie MaidG Walker III, John Physician: Referring Physician: Elvera MariaWalker III, John Weeks in Treatment: 1 Active Problems Location of Pain Severity and Description of Pain Patient Has Paino No Site Locations Pain Management and Medication Current Pain Management: Notes Topical or injectable  lidocaine is offered to patient for acute pain when surgical debridement is performed. If needed, Patient is instructed to use over the counter pain medication for the following 24-48 hours after debridement. Wound care MDs do not prescribed pain medications. Patient has chronic pain or uncontrolled pain. Patient has been instructed to make an appointment with their Primary Care Physician for pain management. Electronic Signature(s) Signed: 06/25/2016 1:55:43 PM By: Curtis Sitesorthy, Joanna Entered By: Curtis Sitesorthy, Joanna on 06/25/2016 10:23:21 Joy Sanchez, Joy SableFAYE T. ([id number]) -------------------------------------------------------------------------------- Patient/Caregiver Education Details Patient Name: Joy Sanchez, Ottis T. Date of Service: 06/25/2016 10:00 AM Medical Record Patient Account Number: [id number] Number: Treating RN: Curtis SitesDorthy, Joanna [birth year] (80 y.o. Other Clinician: Date of Birth/Gender: Female) Treating ROBSON, MICHAEL Primary Care Physician/Extender: Rubie MaidG Walker III, John Physician: Tania AdeWeeks in Treatment: 1 Referring Physician: Jodi MourningWalker III, John Education Assessment Education Provided To: Patient Education Topics Provided Wound/Skin Impairment: Handouts: Other: wound care as ordered Methods:  Demonstration, Explain/Verbal Responses: State content correctly Electronic Signature(s) Signed: 06/25/2016 1:55:43 PM By: Curtis Sites Entered By: Curtis Sites on 06/25/2016 10:48:03 Kirschbaum, Joy Sanchez ([id number]) -------------------------------------------------------------------------------- Wound Assessment Details Patient Name: Joy Nakai T. Date of Service: 06/25/2016 10:00 AM Medical Record Patient Account Number: [id number] Number: Treating RN: Curtis Sites [birth year] (80 y.o. Other Clinician: Date of Birth/Sex: Female) Treating ROBSON, MICHAEL Primary Care Physician/Extender: Rubie Maid, John Physician: Referring Physician: Elvera Maria in Treatment:  1 Wound Status Wound Number: 1 Primary Diabetic Wound/Ulcer of the Lower Etiology: Extremity Wound Location: Right Upper Leg - Medial Secondary Abscess Wounding Event: Gradually Appeared Etiology: Date Acquired: 06/04/2016 Wound Status: Open Weeks Of Treatment: 1 Comorbid Cataracts, Hypertension, Type II Clustered Wound: No History: Diabetes, Osteoarthritis Photos Wound Measurements Length: (cm) 0.1 % Reduction in Ar Width: (cm) 0.1 % Reduction in Vo Depth: (cm) 0.1 Epithelialization Area: (cm) 0.008 Tunneling: Volume: (cm) 0.001 Undermining: ea: 94.9% lume: 96.8% : Large (67-100%) No No Wound Description Classification: Grade 2 Foul Odor After C Wound Margin: Flat and Intact Exudate Amount: None Present leansing: No Wound Bed Granulation Amount: Large (67-100%) Granulation Quality: Pink Necrotic Amount: None Present (0%) Bagnell, Shalona T. ([id number]) Periwound Skin Texture Texture Color No Abnormalities Noted: No No Abnormalities Noted: No Callus: No Atrophie Blanche: No Crepitus: No Cyanosis: No Excoriation: No Ecchymosis: Yes Fluctuance: No Erythema: No Friable: No Hemosiderin Staining: No Induration: Yes Mottled: No Localized Edema: No Pallor: No Rash: No Rubor: No Scarring: No Temperature / Pain Moisture Temperature: No Abnormality No Abnormalities Noted: No Tenderness on Palpation: Yes Dry / Scaly: No Maceration: No Moist: Yes Wound Preparation Ulcer Cleansing: Rinsed/Irrigated with Saline Topical Anesthetic Applied: None Treatment Notes Wound #1 (Right, Medial Upper Leg) 1. Cleansed with: Clean wound with Normal Saline 2. Anesthetic Topical Lidocaine 4% cream to wound bed prior to debridement 4. Dressing Applied: Other dressing (specify in notes) Notes triple antibiotic ointment and coverlet Electronic Signature(s) Signed: 06/25/2016 1:55:43 PM By: Curtis Sites Entered By: Curtis Sites on 06/25/2016 10:43:42 Nodarse, Joy Sanchez  ([id number]) -------------------------------------------------------------------------------- Vitals Details Patient Name: Joy Nakai T. Date of Service: 06/25/2016 10:00 AM Medical Record Patient Account Number: [id number] Number: Treating RN: Curtis Sites 07-Oct-1934 (80 y.o. Other Clinician: Date of Birth/Sex: Female) Treating ROBSON, MICHAEL Primary Care Physician/Extender: Rubie Maid, John Physician: Referring Physician: Elvera Maria in Treatment: 1 Vital Signs Time Taken: 10:23 Temperature (F): 98.0 Height (in): 62 Pulse (bpm): 79 Weight (lbs): 201 Respiratory Rate (breaths/min): 16 Body Mass Index (BMI): 36.8 Blood Pressure (mmHg): 148/60 Reference Range: 80 - 120 mg / dl Electronic Signature(s) Signed: 06/25/2016 1:55:43 PM By: Curtis Sites Entered By: Curtis Sites on 06/25/2016 10:24:22

## 2016-06-26 NOTE — Progress Notes (Signed)
Joy, Sanchez ([id number]) Visit Report for 06/25/2016 Chief Complaint Document Details Patient Name: Joy, Sanchez. Joy Sanchez of Service: 06/25/2016 10:00 AM Medical Record Patient Account Number: [id number] Number: Treating RN: Joy Sanchez [birth year] (80 y.o. Other Clinician: Date of Birth/Sex: Female) Treating [place] Droll Primary Care Physician/Extender: Joy Sanchez Physician: Referring Physician: Elvera Sanchez in Treatment: 1 Information Obtained from: Patient Chief Complaint Joy Sanchez arrives for evaluation of right inner thigh abscess Electronic Signature(s) Signed: 06/25/2016 4:57:31 PM By: Joy Najjar MD Entered By: Joy Sanchez on 06/25/2016 12:16:37 Joy Sanchez ([id number]) -------------------------------------------------------------------------------- HPI Details Patient Name: Joy Joy T. Joy Sanchez of Service: 06/25/2016 10:00 AM Medical Record Patient Account Number: [id number] Number: Treating RN: Joy Sanchez [birth year] (80 y.o. Other Clinician: Date of Birth/Sex: Female) Treating Joy Sanchez Primary Care Physician/Extender: Joy Sanchez Physician: Referring Physician: Elvera Sanchez in Treatment: 1 History of Present Illness HPI Description: Ms. Truxillo is a 81 year old female who arrives today for management of a wound to her right inner thigh. She sought treatment at Urgent care on 06/08/2016 for an abscess at which time it was incised and drained, cultured, packed with iodoform gauze and she was provided a prescription for Bactrim. She returned to the urgent care on 10/30 for a recheck. On 06/10/2016 she presented to the E.R for c/o weakness associated with the Bactrim, thought to be a reaction to the antibiotic. She was given a fluid challenge at which time she admitted to feeling better; she was instructed to stop the Bactrim. She saw her PCP, Dr. Dan Sanchez on 11/2 at which time she was prescribed  doxycycline for MRSA per culture report. At that appointment a referral was made to the wound care center for definitive management. She presents to the clinic today with an ulcer to her right inner thigh. She is tolerating the doxycycline as prescribed and has 4 days left in this prescription. She has been using peroxide and bandages for wound care per her PCP direction. She denies any pain at rest, admits to some irritation with ambulation as she has friction with ambulation. She has been changing dressing daily. She denies any urinary soiling onto dressing. 06/25/16; this is a patient was admitted to the clinic last week. She had a cutaneous abscess in the medial upper thigh which apparently was IandD at a walk-in clinic and showed MRSA. She is been treated with doxycycline after an initial antibiotic prescribed caused nausea and dehydration. [Bactrim] she was seen last week and treated with silver alginate I believe. 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 Joy Sanchez of Service: 06/25/2016 10:00 AM Medical Record Patient Account Number: [id number] Number: Treating RN: Joy Sanchez [birth year] (80 y.o. Other Clinician: Date of Birth/Sex: Female) Treating Saarah Dewing Primary Care Physician/Extender: Joy Sanchez Physician: Referring Physician: Elvera Sanchez in Treatment: 1 Constitutional Sitting or standing Blood Pressure is within target range for patient.. Pulse regular and within target range for patient.Marland Kitchen Respirations regular,  non-labored and within target range.. Temperature is normal and within the target range for the patient.. Notes Wound exam; the area and questions on the medial  right thigh. This has a very tiny opening with a healthy base. There is no evidence of surrounding tenderness or erythema. I think any subcutaneous involvement has resolved as well Electronic Signature(s) Signed: 06/25/2016 4:57:31 PM By: Joy Najjar MD Entered By: Joy Sanchez on 06/25/2016 12:18:58 Joy Sanchez ([id number]) -------------------------------------------------------------------------------- Physician Orders Details Patient Name: Joy Joy T. Joy Sanchez of Service: 06/25/2016 10:00 AM Medical Record Patient Account Number: [id number] Number: Treating RN: Joy Sanchez Oct 18, 1934 (80 y.o. Other Clinician: Date of Birth/Sex: Female) Treating Joy Sanchez Primary Care Physician/Extender: Joy Sanchez Physician: Referring Physician: Elvera Sanchez in Treatment: 1 Verbal / Phone Orders: Yes Clinician: Curtis Sanchez Read Back and Verified: Yes Diagnosis Coding Wound Cleansing Wound #1 Right,Medial Upper Leg o Clean wound with Normal Saline. Anesthetic Wound #1 Right,Medial Upper Leg o Topical Lidocaine 4% cream applied to wound bed prior to debridement Primary Wound Dressing Wound #1 Right,Medial Upper Leg o Other: - triple antibiotic ointment Secondary Dressing Wound #1 Right,Medial Upper Leg o Boardered Foam Dressing - or coverlet Dressing Change Frequency Wound #1 Right,Medial Upper Leg o Change dressing every day. Follow-up Appointments Wound #1 Right,Medial Upper Leg o Return Appointment in 1 week. Electronic Signature(s) Signed: 06/25/2016 1:55:43 PM By: Joy Sanchez Signed: 06/25/2016 4:57:31 PM By: Joy Najjar MD Entered By: Joy Sanchez on 06/25/2016 10:46:39 Joy Sanchez  ([id number]) -------------------------------------------------------------------------------- Problem List Details Patient Name: Joy Joy T. Joy Sanchez of Service: 06/25/2016 10:00 AM Medical Record Patient Account Number: [id number] Number: Treating RN: Joy Sanchez June 16, 1935 (80 y.o. Other Clinician: Date of Birth/Sex: Female) Treating Madyn Ivins Primary Care Physician/Extender: Joy Sanchez Physician: Referring Physician: Elvera Sanchez in Treatment: 1 Active Problems ICD-10 Encounter Code Description Active Joy Sanchez Diagnosis L02.415 Cutaneous abscess of right lower limb 06/19/2016 Yes N82.956 Diabetes mellitus due to underlying condition with other 06/19/2016 Yes skin ulcer E11.9 Type 2 diabetes mellitus without complications 06/19/2016 Yes Inactive Problems Resolved Problems Electronic Signature(s) Signed: 06/25/2016 4:57:31 PM By: Joy Najjar MD Entered By: Joy Sanchez on 06/25/2016 12:16:24 Mills, Joy Sanchez ([id number]) -------------------------------------------------------------------------------- Progress Note Details Patient Name: Joy Joy T. Joy Sanchez of Service: 06/25/2016 10:00 AM Medical Record Patient Account Number: [id number] Number: Treating RN: Joy Sanchez Feb 14, 1935 (80 y.o. Other Clinician: Date of Birth/Sex: Female) Treating Elianne Gubser Primary Care Physician/Extender: Joy Sanchez Physician: Referring Physician: Elvera Sanchez in Treatment: 1 Subjective Chief Complaint Information obtained from Patient Ms. Poole arrives for evaluation of right inner thigh abscess History of Present Illness (HPI) Ms. Keetch is a 81 year old female who arrives today for management of a wound to her right inner thigh. She sought treatment at Urgent care on 06/08/2016 for an abscess at which time it was incised and drained, cultured, packed with iodoform gauze and she was provided a prescription for Bactrim. She  returned to the urgent care on 10/30 for a recheck. On 06/10/2016 she presented to the E.R for c/o weakness associated with the Bactrim, thought to be a reaction to the antibiotic. She was given a fluid challenge at which time she admitted to feeling better; she was instructed to stop the Bactrim. She saw her PCP, Dr. Dan Sanchez on 11/2 at which time she was prescribed doxycycline for MRSA per culture report. At that appointment a referral was made to the wound care center for definitive management. She presents to the clinic today with an ulcer to her right inner thigh. She is tolerating the doxycycline as prescribed and  has 4 days left in this prescription. She has been using peroxide and bandages for wound care per her PCP direction. She denies any pain at rest, admits to some irritation with ambulation as she has friction with ambulation. She has been changing dressing daily. She denies any urinary soiling onto dressing. 06/25/16; this is a patient was admitted to the clinic last week. She had a cutaneous abscess in the medial upper thigh which apparently was IandD at a walk-in clinic and showed MRSA. She is been treated with doxycycline after an initial antibiotic prescribed caused nausea and dehydration. [Bactrim] she was seen last week and treated with silver alginate I believe. 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 Pulse regular and within target range for patient.Marland Kitchen. Respirations regular, non-labored and within target range.. Temperature is normal and within the target range for the patient.Marland Kitchen. Joy Sanchez, Joy T. ([id number]) Vitals Time Taken: 10:23 AM, Height: 62 in, Weight: 201 lbs, BMI: 36.8, Temperature: 98.0 F, Pulse: 79 bpm, Respiratory Rate: 16  breaths/min, Blood Pressure: 148/60 mmHg. General Notes: Wound exam; the area and questions on the medial right thigh. This has a very tiny opening with a healthy base. There is no evidence of surrounding tenderness or erythema. I think any subcutaneous involvement has resolved as well Integumentary (Hair, Skin) Wound #1 status is Open. Original cause of wound was Gradually Appeared. The wound is located on the Right,Medial Upper Leg. The wound measures 0.1cm length x 0.1cm width x 0.1cm depth; 0.008cm^2 area and 0.001cm^3 volume. There is no tunneling or undermining noted. There is a none present amount of drainage noted. The wound margin is flat and intact. There is large (67-100%) pink granulation within the wound bed. There is no necrotic tissue within the wound bed. The periwound skin appearance exhibited: Induration, Moist, Ecchymosis. The periwound skin appearance did not exhibit: Callus, Crepitus, Excoriation, Fluctuance, Friable, Localized Edema, Rash, Scarring, Dry/Scaly, Maceration, Atrophie Blanche, Cyanosis, Hemosiderin Staining, Mottled, Pallor, Rubor, Erythema. Periwound temperature was noted as No Abnormality. The periwound has tenderness on palpation. Assessment Active Problems ICD-10 L02.415 - Cutaneous abscess of right lower limb E08.622 - Diabetes mellitus due to underlying condition with other skin ulcer E11.9 - Type 2 diabetes mellitus without complications Plan Wound Cleansing: Wound #1 Right,Medial Upper Leg: Clean wound with Normal Saline. Anesthetic: Wound #1 Right,Medial Upper Leg: Topical Lidocaine 4% cream applied to wound bed prior to debridement Primary Wound Dressing: Wound #1 Right,Medial Upper Leg: Other: - triple antibiotic ointment Secondary Dressing: Joy Sanchez, Joy T. ([id number]) Wound #1 Right,Medial Upper Leg: Boardered Foam Dressing - or coverlet Dressing Change Frequency: Wound #1 Right,Medial Upper Leg: Change dressing every day. Follow-up  Appointments: Wound #1 Right,Medial Upper Leg: Return Appointment in 1 week. I think triple antibiotic with a cover will heal this over. Should be able to be discharged next week Electronic Signature(s) Signed: 06/25/2016 4:57:31 PM By: Joy Najjarobson, Reality Dejonge MD Entered By: Joy Najjarobson, Illias Pantano on 06/25/2016 12:20:19 Joy Sanchez, Joy SableFAYE T. ([id number]) -------------------------------------------------------------------------------- SuperBill Details Patient Name: Joy NakaiMISE, Joy T. Joy Sanchez of Service: 06/25/2016 Medical Record Patient Account Number: [id number] Number: Treating RN: Joy SitesDorthy, Joanna 13-Jan-1935 (80 y.o. Other Clinician: Date of Birth/Sex: Female) Treating Khamil Lamica Primary Care Physician/Extender: Joy MaidG Walker III, Sanchez Physician: Tania AdeWeeks in Treatment: 1 Referring Physician: Jodi MourningWalker III, Sanchez Diagnosis  Coding ICD-10 Codes Code Description L02.415 Cutaneous abscess of right lower limb E08.622 Diabetes mellitus due to underlying condition with other skin ulcer E11.9 Type 2 diabetes mellitus without complications Facility Procedures CPT4 Code: [id number] Description: 99213 - WOUND CARE VISIT-LEV 3 EST PT Modifier: Quantity: 1 Physician Procedures CPT4 Code: [id number] Description: 99212 - WC PHYS LEVEL 2 - EST PT ICD-10 Description Diagnosis L02.415 Cutaneous abscess of right lower limb Modifier: Quantity: 1 Electronic Signature(s) Signed: 06/25/2016 4:57:31 PM By: Joy Najjarobson, Kaysin Brock MD Entered By: Joy Najjarobson, Kashia Brossard on 06/25/2016 12:23:39

## 2016-07-02 ENCOUNTER — Encounter: Payer: Medicare Other | Admitting: Nurse Practitioner

## 2016-07-02 DIAGNOSIS — L02415 Cutaneous abscess of right lower limb: Secondary | ICD-10-CM | POA: Diagnosis not present

## 2016-07-03 NOTE — Progress Notes (Addendum)
Joy Sanchez, Joy T. ([id number]) Visit Report for 07/02/2016 Chief Complaint Document Details Patient Name: Joy Sanchez, Joy T. 07/02/2016 1:30 Date of Service: PM Medical Record [id number] Number: Patient Account Number: [id number] 1934-11-01 (81 y.o. Treating RN: Joy Sanchez Date of Birth/Sex: Female) Other Clinician: Primary Care Physician: Joy Sanchez Treating Joy Sanchez Referring Physician: Jodi MourningWalker III, Sanchez Physician/Extender: Joy Sanchez in Treatment: 2 Information Obtained from: Patient Chief Complaint [name] arrives for evaluation of right inner thigh abscess Electronic Signature(s) Signed: 07/02/2016 1:40:30 PM By: Joy Sanchez Entered By: Joy Lashoulter, NP, Lakima Dona on 07/02/2016 13:40:30 Deboer, Joy SableFAYE T. ([id number]) -------------------------------------------------------------------------------- HPI Details Patient Name: Joy Sanchez, Joy T. 07/02/2016 1:30 Date of Service: PM Medical Record [id number] Number: Patient Account Number: [id number] 1934-11-01 (80 y.o. Treating RN: Joy Sanchez Date of Birth/Sex: Female) Other Clinician: Primary Care Physician: Joy Sanchez Treating Joy Sanchez Referring Physician: Jodi MourningWalker III, Sanchez Physician/Extender: Joy Sanchez in Treatment: 2 History of Present Illness HPI Description: Ms. Ferdinand CavaMise is a 81 year old female who arrives today for management of a wound to her right inner thigh. She sought treatment at Urgent care on 06/08/2016 for an abscess at which time it was incised and drained, cultured, packed with iodoform gauze and she was provided a prescription for Bactrim. She returned to the urgent care on 10/30 for a recheck. On 06/10/2016 she presented to the E.R for c/o weakness associated with the Bactrim, thought to be a reaction to the antibiotic. She was given a fluid challenge at which time she admitted to feeling better; she was instructed to stop the Bactrim. She saw her PCP, Dr. Dan HumphreysWalker on 11/2 at which time she was prescribed  doxycycline for MRSA per culture report. At that appointment a referral was made to the wound care center for definitive management. She presents to the clinic today with an ulcer to her right inner thigh. She is tolerating the doxycycline as prescribed and has 4 days left in this prescription. She has been using peroxide and bandages for wound care per her PCP direction. She denies any pain at rest, admits to some irritation with ambulation as she has friction with ambulation. She has been changing dressing daily. She denies any urinary soiling onto dressing. 06/25/16; this is a patient was admitted to the clinic last week. She had a cutaneous abscess in the medial upper thigh which apparently was IandD at a walk-in clinic and showed MRSA. She is been treated with doxycycline after an initial antibiotic prescribed caused nausea and dehydration. [Bactrim] she was seen last week and treated with silver alginate I believe. 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 returns for follow-up on her right inner thigh abscess. She states that there has been no drainage, no pain or concerns over the last week Electronic Signature(s) Signed: 07/02/2016 1:41:08 PM By: Joy Sanchez Entered By: Joy Sanchez on 07/02/2016 13:41:08 Burley, Joy SableFAYE T. ([id number]) -------------------------------------------------------------------------------- Physical Exam Details Patient Name: Joy Sanchez, Joy T. 07/02/2016 1:30 Date of Service: PM Medical Record [id number] Number: Patient Account Number: [id number] 1934-11-01 (81 y.o. Treating RN: Joy Sanchez Date of Birth/Sex: Female) Other Clinician: Primary Care Physician: Joy Sanchez Treating Joy Sanchez Referring Physician: Jodi MourningWalker III, Sanchez Physician/Extender: Joy Sanchez in Treatment:  2 Constitutional BP within normal limits. afebrile. Respiratory non-labored respiratory effort. Musculoskeletal ambulated without assistance; steady gait. Integumentary (Hair, Skin) right inner  thigh abscess is completely epithelialized, healed. Psychiatric appears to make sound judgement and have accurate insight regarding healthcare. oriented to time, place, person and situation. calm, pleasant, conversive. Electronic Signature(s) Signed: 07/02/2016 1:41:37 PM By: Joy Lashoulter, NP, Joy Sanchez Entered By: Joy Lashoulter, NP, Kavian Peters on 07/02/2016 13:41:36 Noyce, Joy SableFAYE T. ([id number]) -------------------------------------------------------------------------------- Physician Orders Details Patient Name: Joy Sanchez, Joy T. 07/02/2016 1:30 Date of Service: PM Medical Record [id number] Number: Patient Account Number: [id number] 1935/03/06 (80 y.o. Treating RN: Joy Sanchez Date of Birth/Sex: Female) Other Clinician: Primary Care Physician: Joy Sanchez Treating Joy Sanchez Referring Physician: Jodi MourningWalker III, Sanchez Physician/Extender: Joy Sanchez in Treatment: 2 Verbal / Phone Orders: Yes Clinician: Curtis Sitesorthy, Sanchez Read Back and Verified: Yes Diagnosis Coding ICD-10 Coding Code Description L02.415 Cutaneous abscess of right lower limb E08.622 Diabetes mellitus due to underlying condition with other skin ulcer E11.9 Type 2 diabetes mellitus without complications [facility]charge from Wound Care Center Electronic Signature(s) Signed: 07/02/2016 3:35:01 PM By: Joy Sanchez Signed: 07/08/2016 9:26:16 AM By: Joy Lashoulter, NP, Joy Sanchez Entered By: Joy Sanchez on 07/02/2016 13:41:27 Whitesel, Joy SableFAYE T. ([id number]) -------------------------------------------------------------------------------- Problem List Details Patient Name: Joy Sanchez, Joy T. 07/02/2016 1:30 Date of Service: PM Medical Record [id number] Number: Patient Account Number: [id number] 1935/03/06 (80 y.o. Treating RN: Joy Sitesorthy,  Sanchez Date of Birth/Sex: Female) Other Clinician: Primary Care Physician: Joy Sanchez Treating Nole Robey Referring Physician: Jodi MourningWalker III, Sanchez Physician/Extender: Joy Sanchez in Treatment: 2 Active Problems ICD-10 Encounter Code Description Active Date Diagnosis L02.415 Cutaneous abscess of right lower limb 06/19/2016 Yes G29.52808.622 Diabetes mellitus due to underlying condition with other 06/19/2016 Yes skin ulcer E11.9 Type 2 diabetes mellitus without complications 06/19/2016 Yes Inactive Problems Resolved Problems Electronic Signature(s) Signed: 07/02/2016 1:40:22 PM By: Joy Lashoulter, NP, Jasmarie Coppock Entered By: Joy Lashoulter, NP, Nozomi Mettler on 07/02/2016 13:40:22 Whorley, Joy SableFAYE T. ([id number]) -------------------------------------------------------------------------------- Progress Note Details Patient Name: Joy Sanchez, Vonnetta T. 07/02/2016 1:30 Date of Service: PM Medical Record [id number] Number: Patient Account Number: [id number] 1935/03/06 (80 y.o. Treating RN: Joy Sanchez Date of Birth/Sex: Female) Other Clinician: Primary Care Physician: Joy Sanchez Treating Osmany Azer Referring Physician: Jodi MourningWalker III, Sanchez Physician/Extender: Joy Sanchez in Treatment: 2 Subjective Chief Complaint Information obtained from Patient Ms. Gautney arrives for evaluation of right inner thigh abscess History of Present Illness (HPI) Ms. Ferdinand CavaMise is a 81 year old female who arrives today for management of a wound to her right inner thigh. She sought treatment at Urgent care on 06/08/2016 for an abscess at which time it was incised and drained, cultured, packed with iodoform gauze and she was provided a prescription for Bactrim. She returned to the urgent care on 10/30 for a recheck. On 06/10/2016 she presented to the E.R for c/o weakness associated with the Bactrim, thought to be a reaction to the antibiotic. She was given a fluid challenge at which time she admitted to feeling better; she was instructed to stop the Bactrim.  She saw her PCP, Dr. Dan HumphreysWalker on 11/2 at which time she was prescribed doxycycline for MRSA per culture report. At that appointment a referral was made to the wound care center for definitive management. She presents to the clinic today with an ulcer to her right inner thigh. She is tolerating the doxycycline as prescribed and has 4 days left in this prescription. She has been using peroxide and bandages for wound care per her PCP direction. She denies any pain at rest, admits to some irritation with ambulation as she has friction with ambulation. She has been changing dressing daily. She denies any urinary  soiling onto dressing. 06/25/16; this is a patient was admitted to the clinic last week. She had a cutaneous abscess in the medial upper thigh which apparently was IandD at a walk-in clinic and showed MRSA. She is been treated with doxycycline after an initial antibiotic prescribed caused nausea and dehydration. [Bactrim] she was seen last week and treated with silver alginate I believe. 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 Catalano returns for follow-up on her right inner thigh abscess. She states that there has been no drainage, no pain or concerns over the last week Objective Constitutional BP within normal limits. afebrile. Joy Sanchez, Joy Sanchez ([id number]) Vitals Time Taken: 1:22 PM, Height: 62 in, Weight: 201 lbs, BMI: 36.8, Temperature: 98.2 F, Pulse: 82 bpm, Respiratory Rate: 16 breaths/min, Blood Pressure: 124/51 mmHg. Respiratory non-labored respiratory effort. Musculoskeletal ambulated without assistance; steady gait. Psychiatric appears to make sound judgement and have accurate insight regarding healthcare. oriented to time, place, person and situation. calm, pleasant, conversive. Integumentary (Hair, Skin) right inner thigh  abscess is completely epithelialized, healed. Wound #1 status is Healed - Epithelialized. Original cause of wound was Gradually Appeared. The wound is located on the Right,Medial Upper Leg. The wound measures 0cm length x 0cm width x 0cm depth; 0cm^2 area and 0cm^3 volume. The wound is limited to skin breakdown. There is no tunneling or undermining noted. There is a none present amount of drainage noted. The wound margin is flat and intact. There is no granulation within the wound bed. There is no necrotic tissue within the wound bed. The periwound skin appearance did not exhibit: Callus, Crepitus, Excoriation, Fluctuance, Friable, Induration, Localized Edema, Rash, Scarring, Dry/Scaly, Maceration, Moist, Atrophie Blanche, Cyanosis, Ecchymosis, Hemosiderin Staining, Mottled, Pallor, Rubor, Erythema. Periwound temperature was noted as No Abnormality. Assessment Active Problems ICD-10 L02.415 - Cutaneous abscess of right lower limb E08.622 - Diabetes mellitus due to underlying condition with other skin ulcer E11.9 - Type 2 diabetes mellitus without complications Plan Discharge From [facility]: Joy Sanchez, Joy Sanchez ([id number]) Discharge from Wound Care Center Follow-Up Appointments: A Patient Clinical Summary of Care was provided to FM 1. Discharge from wound care center today 2. She was encouraged to contact wound care center for any future concerns or wound care needs Electronic Signature(s) Signed: 07/07/2016 9:27:17 AM By: Joy Lash, NP, Jaelee Laughter Previous Signature: 07/02/2016 1:42:16 PM Version By: Joy Lash, NP, Frank Novelo Entered By: Joy Lash, NP, Julias Mould on 07/07/2016 09:27:17 Joy Sanchez, Joy Sanchez ([id number]) -------------------------------------------------------------------------------- SuperBill Details Patient Name: Joy Nakai T. Date of Service: 07/02/2016 Medical Record Number: [id number] Patient Account Number: [id number] Date of Birth/Sex: June 18, 1935 (80 y.o. Female) Treating RN: Joy Sites Primary Care Physician: Joy Mourning Other Clinician: Referring Physician: Jodi Mourning Treating Physician/Extender: Kathreen Cosier in Treatment: 2 Diagnosis Coding ICD-10 Codes Code Description L02.415 Cutaneous abscess of right lower limb E08.622 Diabetes mellitus due to underlying condition with other skin ulcer E11.9 Type 2 diabetes mellitus without complications Facility Procedures CPT4 Code: [id number] Description: [id number] - WOUND CARE VISIT-LEV 2 EST PT Modifier: Quantity: 1 Physician Procedures CPT4 Code: [id number] Description: 66440 - WC PHYS LEVEL 2 - EST PT ICD-10 Description Diagnosis L02.415 Cutaneous abscess of right lower limb Modifier: Quantity: 1 Electronic Signature(s) Signed: 07/02/2016 1:42:32 PM By: Joy Lash, NP, Joy Pile By: Joy Lash, NP, Steffani Dionisio on 07/02/2016 13:42:31

## 2016-09-25 ENCOUNTER — Other Ambulatory Visit: Payer: Self-pay | Admitting: Internal Medicine

## 2016-09-25 DIAGNOSIS — Z1231 Encounter for screening mammogram for malignant neoplasm of breast: Secondary | ICD-10-CM

## 2016-11-05 ENCOUNTER — Ambulatory Visit
Admission: RE | Admit: 2016-11-05 | Discharge: 2016-11-05 | Disposition: A | Discharge status: Home / Self Care | Payer: Medicare Other | Source: Ambulatory Visit | Attending: Internal Medicine | Admitting: Internal Medicine

## 2016-11-05 DIAGNOSIS — Z1231 Encounter for screening mammogram for malignant neoplasm of breast: Secondary | ICD-10-CM | POA: Insufficient documentation

## 2017-10-14 ENCOUNTER — Other Ambulatory Visit: Payer: Self-pay | Admitting: Internal Medicine

## 2017-10-14 DIAGNOSIS — Z1231 Encounter for screening mammogram for malignant neoplasm of breast: Secondary | ICD-10-CM

## 2017-11-06 ENCOUNTER — Ambulatory Visit
Admission: RE | Admit: 2017-11-06 | Discharge: 2017-11-06 | Disposition: A | Discharge status: Home / Self Care | Payer: Medicare Other | Source: Ambulatory Visit | Attending: Internal Medicine | Admitting: Internal Medicine

## 2017-11-06 DIAGNOSIS — Z1231 Encounter for screening mammogram for malignant neoplasm of breast: Secondary | ICD-10-CM | POA: Insufficient documentation

## 2019-01-11 ENCOUNTER — Other Ambulatory Visit: Payer: Self-pay | Admitting: Internal Medicine

## 2019-01-11 DIAGNOSIS — Z1231 Encounter for screening mammogram for malignant neoplasm of breast: Secondary | ICD-10-CM

## 2019-02-08 ENCOUNTER — Other Ambulatory Visit: Payer: Self-pay

## 2019-02-08 ENCOUNTER — Ambulatory Visit
Admission: RE | Admit: 2019-02-08 | Discharge: 2019-02-08 | Disposition: A | Discharge status: Home / Self Care | Payer: Medicare Other | Source: Ambulatory Visit | Attending: Internal Medicine | Admitting: Internal Medicine

## 2019-02-08 DIAGNOSIS — Z1231 Encounter for screening mammogram for malignant neoplasm of breast: Secondary | ICD-10-CM | POA: Insufficient documentation

## 2020-05-11 ENCOUNTER — Other Ambulatory Visit: Payer: Self-pay | Admitting: Family Medicine

## 2020-05-11 DIAGNOSIS — M5030 Other cervical disc degeneration, unspecified cervical region: Secondary | ICD-10-CM

## 2020-05-11 DIAGNOSIS — M5412 Radiculopathy, cervical region: Secondary | ICD-10-CM

## 2020-05-27 ENCOUNTER — Ambulatory Visit
Admission: RE | Admit: 2020-05-27 | Discharge: 2020-05-27 | Disposition: A | Discharge status: Home / Self Care | Payer: Medicare Other | Source: Ambulatory Visit | Attending: Family Medicine | Admitting: Family Medicine

## 2020-05-27 DIAGNOSIS — M5030 Other cervical disc degeneration, unspecified cervical region: Secondary | ICD-10-CM | POA: Diagnosis present

## 2020-05-27 DIAGNOSIS — M5412 Radiculopathy, cervical region: Secondary | ICD-10-CM | POA: Diagnosis present

## 2020-11-11 ENCOUNTER — Emergency Department
Admission: EM | Admit: 2020-11-11 | Discharge: 2020-11-11 | Disposition: A | Discharge status: Home / Self Care | Payer: Medicare Other | Attending: Emergency Medicine | Admitting: Emergency Medicine

## 2020-11-11 ENCOUNTER — Other Ambulatory Visit: Payer: Self-pay

## 2020-11-11 ENCOUNTER — Emergency Department: Payer: Medicare Other

## 2020-11-11 DIAGNOSIS — E119 Type 2 diabetes mellitus without complications: Secondary | ICD-10-CM | POA: Diagnosis not present

## 2020-11-11 DIAGNOSIS — M25551 Pain in right hip: Secondary | ICD-10-CM

## 2020-11-11 DIAGNOSIS — M160 Bilateral primary osteoarthritis of hip: Secondary | ICD-10-CM | POA: Insufficient documentation

## 2020-11-11 DIAGNOSIS — I10 Essential (primary) hypertension: Secondary | ICD-10-CM | POA: Insufficient documentation

## 2020-11-11 DIAGNOSIS — Z79899 Other long term (current) drug therapy: Secondary | ICD-10-CM | POA: Insufficient documentation

## 2020-11-11 DIAGNOSIS — S7001XA Contusion of right hip, initial encounter: Secondary | ICD-10-CM

## 2020-11-11 DIAGNOSIS — W01198A Fall on same level from slipping, tripping and stumbling with subsequent striking against other object, initial encounter: Secondary | ICD-10-CM | POA: Insufficient documentation

## 2020-11-11 DIAGNOSIS — Z7982 Long term (current) use of aspirin: Secondary | ICD-10-CM | POA: Diagnosis not present

## 2020-11-11 DIAGNOSIS — S79911A Unspecified injury of right hip, initial encounter: Secondary | ICD-10-CM | POA: Diagnosis present

## 2020-11-11 DIAGNOSIS — K5730 Diverticulosis of large intestine without perforation or abscess without bleeding: Secondary | ICD-10-CM | POA: Insufficient documentation

## 2020-11-11 LAB — COMPREHENSIVE METABOLIC PANEL
ALT: 17 U/L (ref 0–44)
AST: 19 U/L (ref 15–41)
Albumin: 3.8 g/dL (ref 3.5–5.0)
Alkaline Phosphatase: 59 U/L (ref 38–126)
Anion gap: 10 (ref 5–15)
BUN: 19 mg/dL (ref 8–23)
CO2: 25 mmol/L (ref 22–32)
Calcium: 9.2 mg/dL (ref 8.9–10.3)
Chloride: 103 mmol/L (ref 98–111)
Creatinine, Ser: 0.91 mg/dL (ref 0.44–1.00)
GFR, Estimated: >60 mL/min (ref >60)
Glucose, Bld: 138 mg/dL — ABNORMAL HIGH (ref 70–99)
Potassium: 4.4 mmol/L (ref 3.5–5.1)
Sodium: 138 mmol/L (ref 135–145)
Total Bilirubin: 0.7 mg/dL (ref 0.3–1.2)
Total Protein: 6.7 g/dL (ref 6.5–8.1)

## 2020-11-11 LAB — CBC WITH DIFFERENTIAL/PLATELET
Abs Immature Granulocytes: 0.04 10*3/uL (ref 0.00–0.07)
Basophils Absolute: 0 10*3/uL (ref 0.0–0.1)
Basophils Relative: 0 %
Eosinophils Absolute: 0 10*3/uL (ref 0.0–0.5)
Eosinophils Relative: 0 %
HCT: 40.3 % (ref 36.0–46.0)
Hemoglobin: 13.6 g/dL (ref 12.0–15.0)
Immature Granulocytes: 0 %
Lymphocytes Relative: 12 %
Lymphs Abs: 1.3 10*3/uL (ref 0.7–4.0)
MCH: 32.4 pg (ref 26.0–34.0)
MCHC: 33.7 g/dL (ref 30.0–36.0)
MCV: 96 fL (ref 80.0–100.0)
Monocytes Absolute: 1 10*3/uL (ref 0.1–1.0)
Monocytes Relative: 9 %
Neutro Abs: 8.8 10*3/uL — ABNORMAL HIGH (ref 1.7–7.7)
Neutrophils Relative %: 79 %
Platelets: 181 10*3/uL (ref 150–400)
RBC: 4.2 MIL/uL (ref 3.87–5.11)
RDW: 13.2 % (ref 11.5–15.5)
WBC: 11.3 10*3/uL — ABNORMAL HIGH (ref 4.0–10.5)
nRBC: 0 % (ref 0.0–0.2)

## 2020-11-11 MED ORDER — LIDOCAINE 5 % EX PTCH: 1.0000 | MEDICATED_PATCH | Freq: Two times a day (BID) | CUTANEOUS | 0 refills | Status: AC

## 2020-11-11 MED ORDER — DIAZEPAM 2 MG PO TABS: ORAL_TABLET | ORAL | 0 refills | Status: DC

## 2020-11-11 MED ORDER — DIAZEPAM 2 MG PO TABS
2.0000 mg | ORAL_TABLET | Freq: Once | ORAL | Status: AC
  Administered 2020-11-11: 2 mg via ORAL
  Filled 2020-11-11: qty 1

## 2020-11-11 MED ORDER — LIDOCAINE 5 % EX PTCH
1.0000 | MEDICATED_PATCH | CUTANEOUS | Status: DC
  Administered 2020-11-11: 1 via TRANSDERMAL
  Filled 2020-11-11: qty 1

## 2020-11-11 NOTE — ED Triage Notes (Addendum)
Pt presents to ER c/o fall on Friday and right hip pain that started after fall. Pt lost her balance and fell.  Did not lose consciousness.  Pt states she had hip surgery on right hip last year.  Pt states she has had spasms going down right leg since fall.  Pt states she has been able to walk following the fall, but it has got progressively harder.  Pt A&O x4 at this time.

## 2020-11-11 NOTE — ED Notes (Signed)
See triage note  Presents s/p fall  States she lost her balance and fell on [place] floor   Having pain to right hip   Hx of fx to same hip

## 2020-11-11 NOTE — ED Notes (Signed)
Up to bathroom with assistance  conts' to complain of muscle spasm type pain  Attempted to obtain urine sample w/o success  Provider aware

## 2020-11-11 NOTE — ED Provider Notes (Signed)
[facility] Emergency Department Provider Note  ____________________________________________   Event Date/Time   First MD Initiated Contact with Patient 11/11/20 [phone]     (approximate)  I have reviewed the triage vital signs and the nursing notes.   HISTORY  Chief Complaint Fall and Hip Pain   HPI Joy Sanchez is a 86 y.o. female presents to the ED with son with complaint of right hip pain.  Patient states that she lost her balance 2 days ago and initially hit the door frame and ended up on the floor.  Son is present with patient and states that he was with her.  There was no head injury or LOC.  Patient has continued to ambulate since that time.  He states that this morning she complained of more pain when she got up and patient complains of muscle spasms with movement.  Patient is status post right hip replacement 1 year ago.  Patient denies any dizziness, nausea or vomiting.  She denies any other injuries.  Currently she rates her pain as 3 out of 10.       Past Medical History:  Diagnosis Date  . Diabetes mellitus without complication (HCC)   . GERD (gastroesophageal reflux disease)   . Hypercholesteremia   . Hypertension   . Osteoporosis     There are no problems to display for this patient.   Past Surgical History:  Procedure Laterality Date  . ABDOMINAL HYSTERECTOMY    . APPENDECTOMY    . KNEE SURGERY Bilateral     Prior to Admission medications   Medication Sig Start Date End Date Taking? Authorizing Provider  diazepam (VALIUM) 2 MG tablet 1/2 to 1 tablet q 8 hours prn muscle spasms 11/11/20  Yes Bridget HartshornSummers, Olive Motyka L, PA-C  lidocaine (LIDODERM) 5 % Place 1 patch onto the skin every 12 (twelve) hours. Remove & Discard patch within 12 hours or as directed by MD 11/11/20 11/11/21 Yes Levada SchillingSummers, Aamilah Augenstein L, PA-C  amLODipine (NORVASC) 5 MG tablet Take 5 mg by mouth daily.    [provider]  aspirin EC 81 MG tablet Take 81 mg by mouth  daily.    [provider]  atorvastatin (LIPITOR) 10 MG tablet Take 10 mg by mouth daily.    [provider]  calcium citrate (CALCITRATE - DOSED IN MG ELEMENTAL CALCIUM) 950 MG tablet Take 200 mg of elemental calcium by mouth daily.    [provider]  ezetimibe (ZETIA) 10 MG tablet Take 10 mg by mouth daily.    [provider]  gabapentin (NEURONTIN) 300 MG capsule Take 300 mg by mouth 3 (three) times daily.    [provider]  losartan-hydrochlorothiazide (HYZAAR) 100-25 MG tablet Take 1 tablet by mouth daily.    [provider]  Multiple Vitamin (MULTIVITAMIN) capsule Take 1 capsule by mouth daily.    [provider]    Allergies Bisphosphonates and Codeine  Family History  Problem Relation Age of Onset  . Breast cancer Mother        7480's    Social History Social History   Tobacco Use  . Smoking status: Never Smoker  . Smokeless tobacco: Never Used  Substance Use Topics  . Alcohol use: No    Review of Systems Constitutional: No fever/chills Eyes: No visual changes. ENT: No trauma. Cardiovascular: Denies chest pain. Respiratory: Denies shortness of breath. Gastrointestinal: No abdominal pain.  No nausea, no vomiting.   Genitourinary: Negative for dysuria. Musculoskeletal: Negative for  back pain.  Positive for right hip pain.  Positive for muscle spasms right leg. Skin: Negative for rash. Neurological: Negative for headaches, focal weakness or numbness.  ____________________________________________   PHYSICAL EXAM:  VITAL SIGNS: ED Triage Vitals  Enc Vitals Group     BP 11/11/20 0618 137/70     Pulse Rate 11/11/20 0618 92     Resp 11/11/20 0618 18     Temp 11/11/20 0618 97.7 F (36.5 C)     Temp Source 11/11/20 0618 Oral     SpO2 11/11/20 0618 97 %     Weight 11/11/20 0619 172 lb (78 kg)     Height 11/11/20 0619 5\' 2"  (1.575 m)     Head Circumference --      Peak Flow --      Pain Score  11/11/20 0619 3     Pain Loc --      Pain Edu? --      Excl. in GC? --     Constitutional: Alert and oriented. Well appearing and in no acute distress.  Patient answers questions appropriately and son is present and confirms that answers are correct. Eyes: Conjunctivae are normal. PERRL. EOMI. Head: Atraumatic. Nose: No trauma. Mouth/Throat: No trauma.   Neck: No stridor.  No cervical tenderness on palpation posteriorly. Cardiovascular: Normal rate, regular rhythm. Grossly normal heart sounds.  Good peripheral circulation. Respiratory: Normal respiratory effort.  No retractions. Lungs CTAB.  Nontender ribs to palpation. Gastrointestinal: Soft and nontender. No distention.  Bowel sounds are normoactive x4 quadrants. Musculoskeletal: On examination of the right hip there is no gross deformity, no rotation and no shortening of the extremity and comparison to the left.  No pain on compression of the pelvis area.  Patient reports pain with straight leg lift of the left leg over into her right hip area.  No pain on palpation of the thoracic or lumbar spine.  Patient is able move upper extremities without any difficulty.  Patient was able to transfer from the wheelchair to the stretcher with assistance. Neurologic:  Normal speech and language. No gross focal neurologic deficits are appreciated.  Skin:  Skin is warm, dry and intact. No rash noted. Psychiatric: Mood and affect are normal. Speech and behavior are normal.  ____________________________________________   LABS (all labs ordered are listed, but only abnormal results are displayed)  Labs Reviewed  CBC WITH DIFFERENTIAL/PLATELET - Abnormal; Notable for the following components:      Result Value   WBC 11.3 (*)    Neutro Abs 8.8 (*)    All other components within normal limits  COMPREHENSIVE METABOLIC PANEL - Abnormal; Notable for the following components:   Glucose, Bld 138 (*)    All other components within normal limits   URINALYSIS, COMPLETE (UACMP) WITH MICROSCOPIC   ___________________________________________  RADIOLOGY 01/11/21, personally viewed and evaluated these images (plain radiographs) as part of my medical decision making, as well as reviewing the written report by the radiologist.   Official radiology report(s): CT PELVIS WO CONTRAST  Result Date: 11/11/2020 CLINICAL DATA:  Recent fall with pelvic pain. Reported right hip surgery last year. EXAM: CT PELVIS WITHOUT CONTRAST TECHNIQUE: Multidetector CT imaging of the pelvis was performed following the standard protocol without intravenous contrast. COMPARISON:  11/11/2020 pelvic and right hip radiographs. FINDINGS: Urinary Tract:  Normal bladder.  Normal caliber pelvic ureters. Bowel: Marked sigmoid diverticulosis. No dilated or thick-walled pelvic bowel loops. Vascular/Lymphatic: Mild bilateral iliac atherosclerosis. No pathologically enlarged pelvic  lymph nodes. Reproductive: Status post hysterectomy, with no abnormal findings at the vaginal cuff. No adnexal mass. Other:  No pelvic ascites, pneumoperitoneum or fluid collections. Musculoskeletal: No aggressive appearing focal osseous lesions. Healed deformity in the intertrochanteric proximal right femur with intramedullary rod in the proximal right femoral metadiaphysis with interlocking right femoral neck pin and single distal interlocking screw with no evidence of hardware fracture or loosening. No acute osseous fracture in the pelvis. No hip dislocation. Moderate left and mild right hip osteoarthritis. Degenerative changes at the pubic symphysis and bilateral sacroiliac joints. Prominent bilateral lower lumbar facet arthropathy. IMPRESSION: 1. No acute osseous fracture in the pelvis.  No malalignment. 2. Healed deformity in the intertrochanteric proximal right femur status post ORIF, with no evidence of hardware complication. 3. Moderate left and mild right hip osteoarthritis. 4. Marked sigmoid  diverticulosis. Electronically Signed   By: Delbert Phenix M.D.   On: 11/11/2020 09:36   DG Hip Unilat  With Pelvis 2-3 Views Right  Result Date: 11/11/2020 CLINICAL DATA:  86 year old female status post fall 2 days ago with right hip pain. EXAM: DG HIP (WITH OR WITHOUT PELVIS) 2-3V RIGHT COMPARISON:  Right hip series 03/19/2007. KUB 10/29/2012. FINDINGS: Proximal right femur ORIF since 2014. Proximal femoral intramedullary rod with interlocking dynamic hip screw and distal interlocking cortical screw. Hardware appears intact. Underlying intertrochanteric fracture appears to be chronic. No definite acute proximal right femur fracture. Femoral heads are normally located. Chronic asymmetric left hip joint space loss appears similar to 2014. No pelvis fracture identified. SI joints within normal limits. Lower lumbar spine degeneration. Negative visible bowel gas pattern. Chronic right hemipelvis phlebolith. IMPRESSION: 1. Proximal right femur ORIF is new from prior exams. Prior intertrochanteric fracture on that side appears to be chronic. No definite acute osseous abnormality identified. 2. Chronic left hip osteoarthritis. Electronically Signed   By: Odessa Fleming M.D.   On: 11/11/2020 07:30    ____________________________________________   PROCEDURES  Procedure(s) performed (including Critical Care):  Procedures   ____________________________________________   INITIAL IMPRESSION / ASSESSMENT AND PLAN / ED COURSE  As part of my medical decision making, I reviewed the following data within the electronic MEDICAL RECORD NUMBER Notes from prior ED visits and [place] Controlled Substance Database  86 year old female presents to the ED with complaint of muscle spasms to her right hip and leg area.  Patient states that she lost her balance hitting the door frame first and then falling to the floor.  Her son was with her and witnessed this encounter.  Patient had no head injury or loss of consciousness.  Patient has  been ambulatory since this happened 2 days ago.  This morning she woke up and was having difficulty in which she describes it as a muscle spasm.  Patient was given diazepam 2 mg p.o. while in the ED.  Patient denied any drowsiness with this medication and states that it has given her some relief.  X-rays of her right hip failed to show a fracture and surgical hardware was in place.  A CT was ordered to evaluate the pelvis which was reported as no acute injury.  Son states that he lives with her and that another family member stays with her also so that she is not there by herself.  Patient is aware that the medication could cause drowsiness and increase her risk for falling.  A prescription for diazepam 2 mg 1 every 8 hours as needed for muscle spasms was sent to her pharmacy along  with prescription for Lidoderm patch to apply to her right hip as needed to help control pain without drowsiness.  Patient is to follow-up with her PCP if any continued problems or return to the emergency department if there is worsening of her symptoms.  Son is present and also acknowledges discharge instructions.  ____________________________________________   FINAL CLINICAL IMPRESSION(S) / ED DIAGNOSES  Final diagnoses:  Right hip pain  Contusion of right hip, initial encounter     ED Discharge Orders         Ordered    lidocaine (LIDODERM) 5 %  Every 12 hours        11/11/20 1015    diazepam (VALIUM) 2 MG tablet        11/11/20 1015          *Please note:  Tiffancy Moger was evaluated in Emergency Department on 11/11/2020 for the symptoms described in the history of present illness. She was evaluated in the context of the global COVID-19 pandemic, which necessitated consideration that the patient might be at risk for infection with the SARS-CoV-2 virus that causes COVID-19. Institutional protocols and algorithms that pertain to the evaluation of patients at risk for COVID-19 are in a state of rapid change  based on information released by regulatory bodies including the CDC and federal and state organizations. These policies and algorithms were followed during the patient's care in the ED.  Some ED evaluations and interventions may be delayed as a result of limited staffing during and the pandemic.*   Note:  This document was prepared using Dragon voice recognition software and may include unintentional dictation errors. Striking the door frame first and then onto the floor.   Tommi Rumps, PA-C 11/11/20 1533    Gilles Chiquito, MD 11/11/20 [phone]

## 2020-11-11 NOTE — Discharge Instructions (Addendum)
Follow-up with your primary care provider if any continued problems or concerns.  You may use ice to your hip or moist heat for comfort.  A Lidoderm patch was applied to your hip while in the emergency department to help with pain.  This medication will not cause drowsiness.  For muscle spasms diazepam was sent to your pharmacy.  This is 1/2 to 1 tablet every 8 hours as needed for muscle spasms.  Use your walker when up walking if you are using the muscle relaxant to avoid falling as this medication could cause drowsiness.

## 2021-06-10 IMAGING — CT CT PELVIS W/O CM
2 of 3 series · 16 of 46 positions shown, 18 images · non-contrast
Comparison: 11/11/2020 pelvic and right hip radiographs.

CLINICAL DATA: Recent fall with pelvic pain. Reported right hip
surgery last year.

EXAM:
CT PELVIS WITHOUT CONTRAST
TECHNIQUE: Multidetector CT imaging of the pelvis was performed following the
standard protocol without intravenous contrast.

[Series 3: axial st · axial · 0.98mm/px · z∈[-422,-134]mm · 13 of 166 slices shown, 15 images]
[im 11/166  soft-tissue]
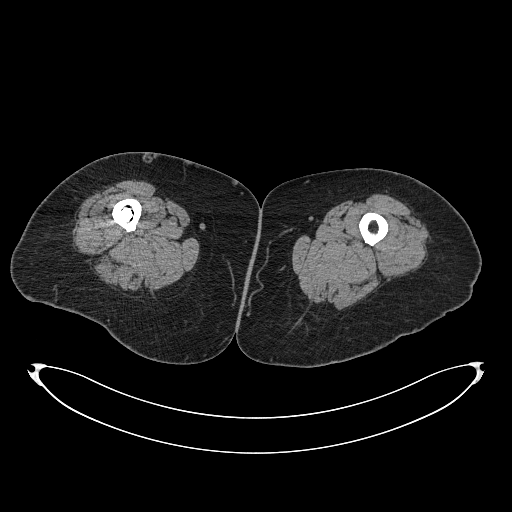
[im 11/166  bone]
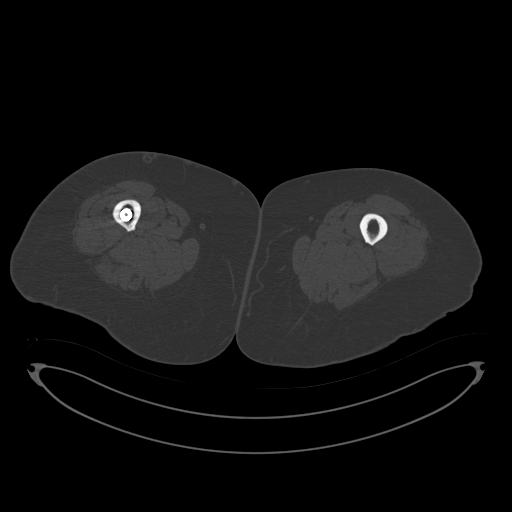
[im 22/166  soft-tissue]
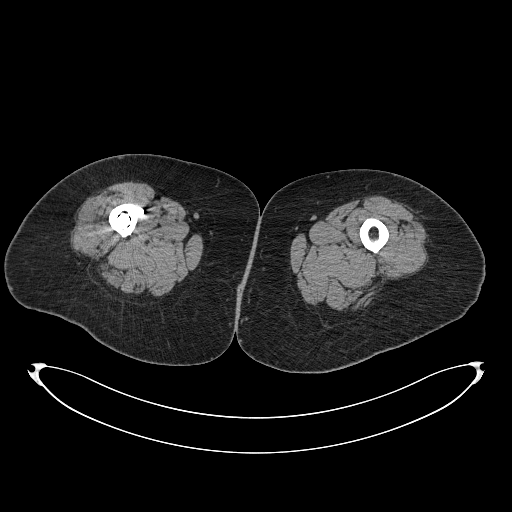
[im 32/166  soft-tissue]
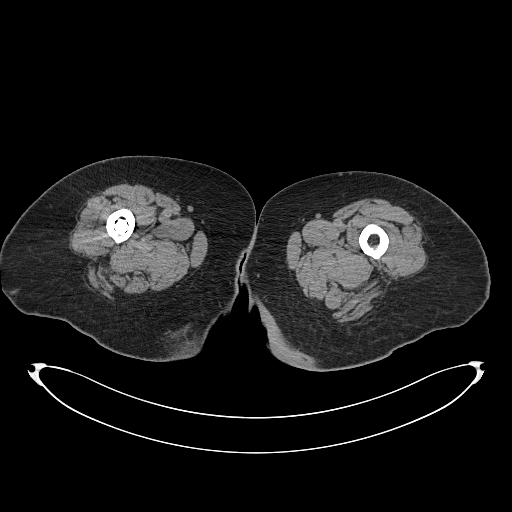
[im 48/166  soft-tissue]
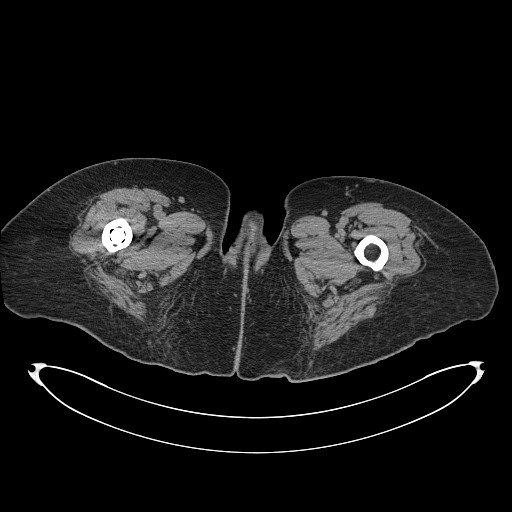
[im 59/166  soft-tissue]
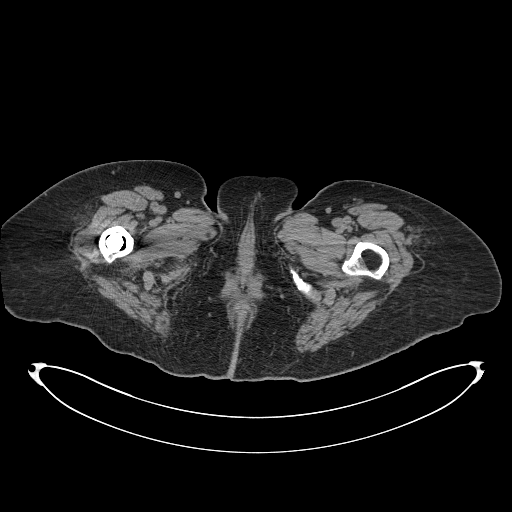
[im 70/166  soft-tissue]
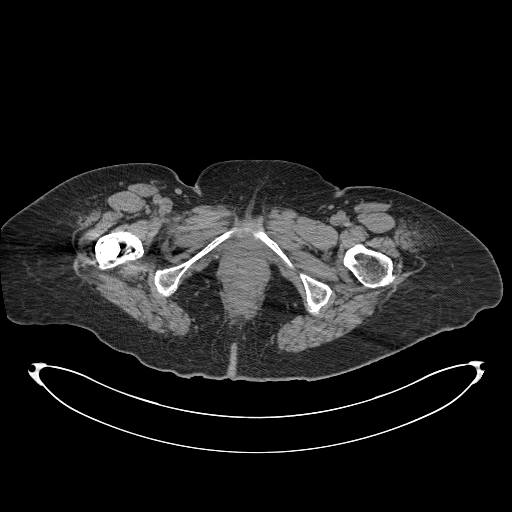
[im 86/166  soft-tissue]
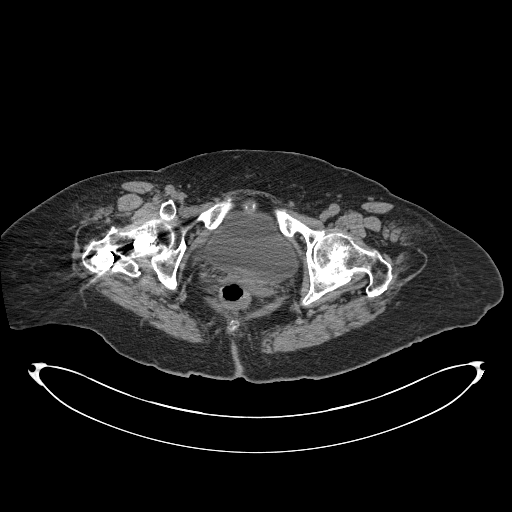
[im 96/166  soft-tissue]
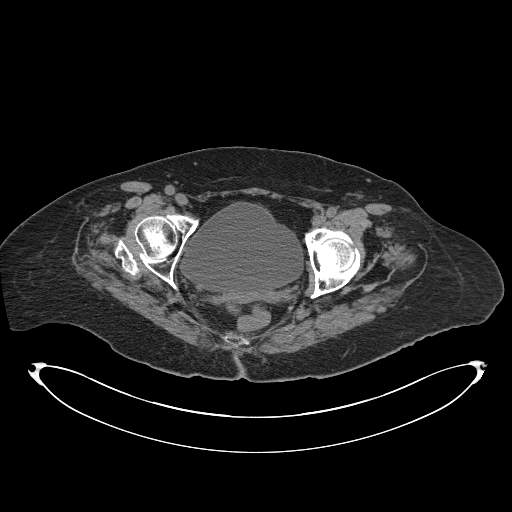
[im 107/166  soft-tissue]
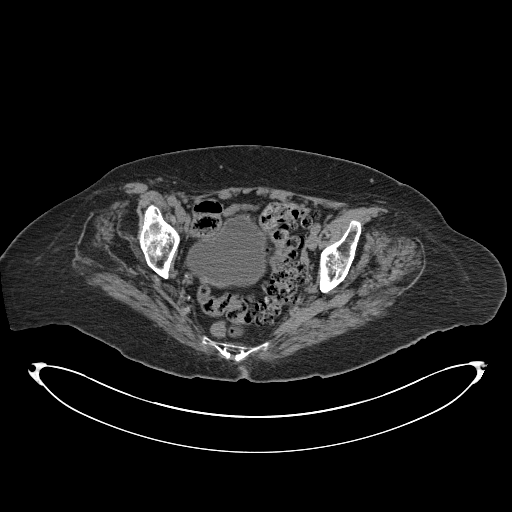
[im 107/166  bone]
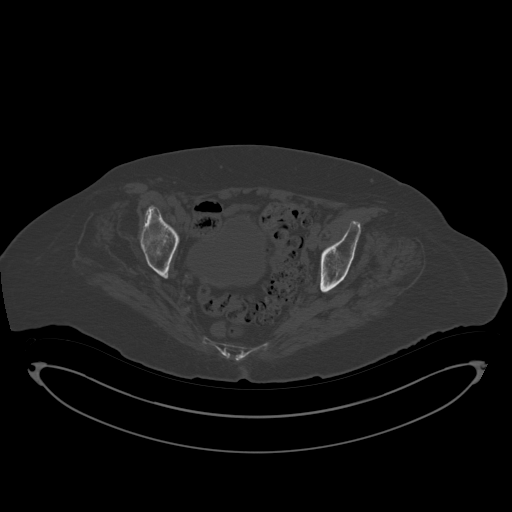
[im 118/166  soft-tissue]
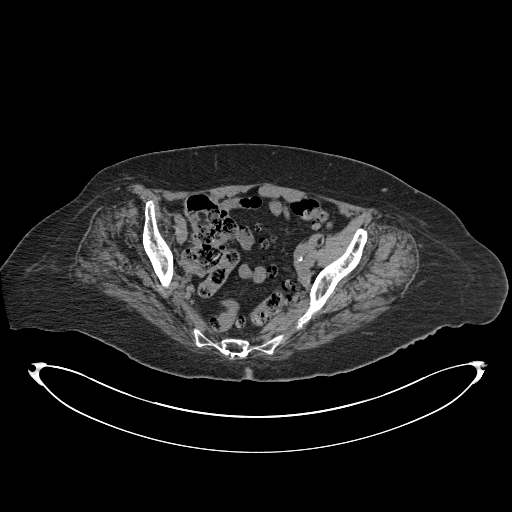
[im 134/166  soft-tissue]
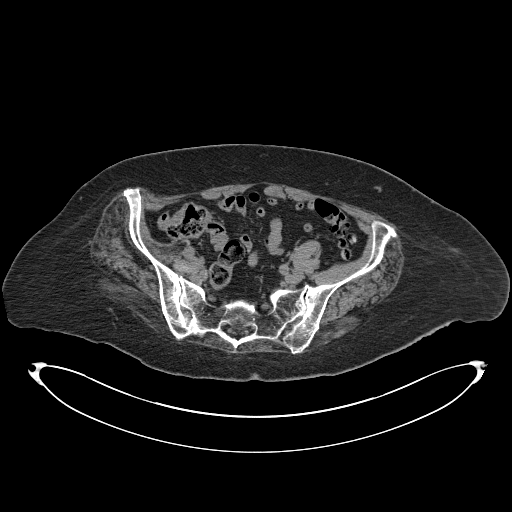
[im 144/166  soft-tissue]
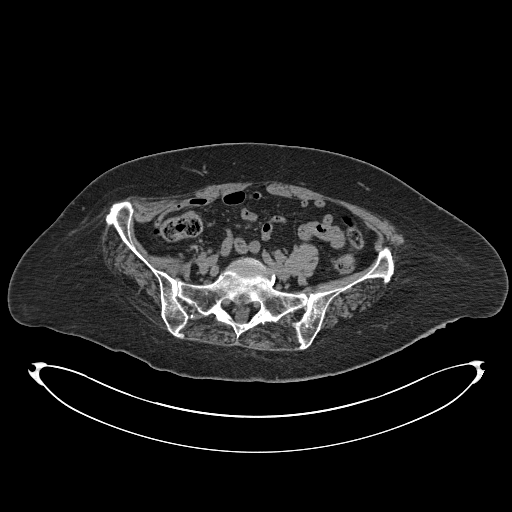
[im 155/166  soft-tissue]
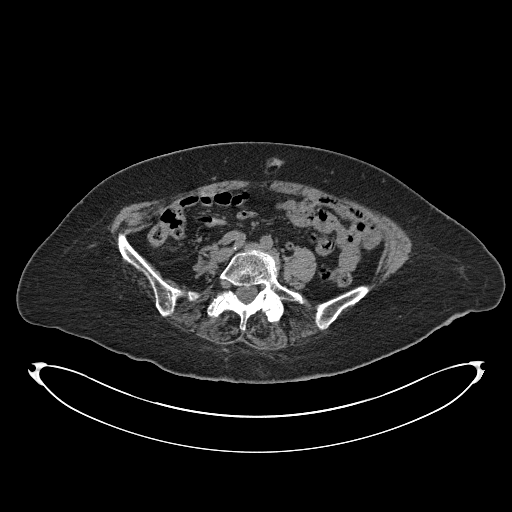

[Series 6: coronal st · coronal · 0.70mm/px · 3 of 105 slices shown]
[im 35/105  soft-tissue]
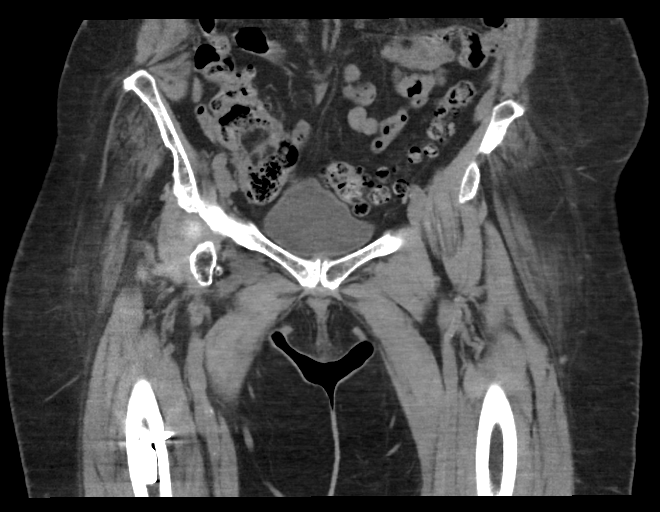
[im 47/105  soft-tissue]
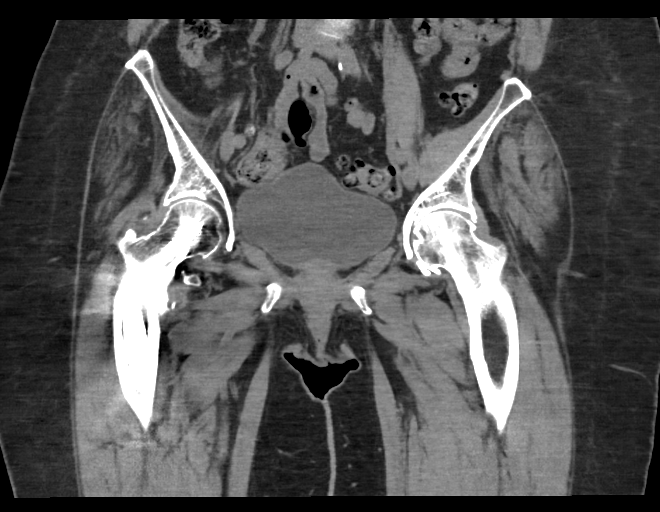
[im 58/105  soft-tissue]
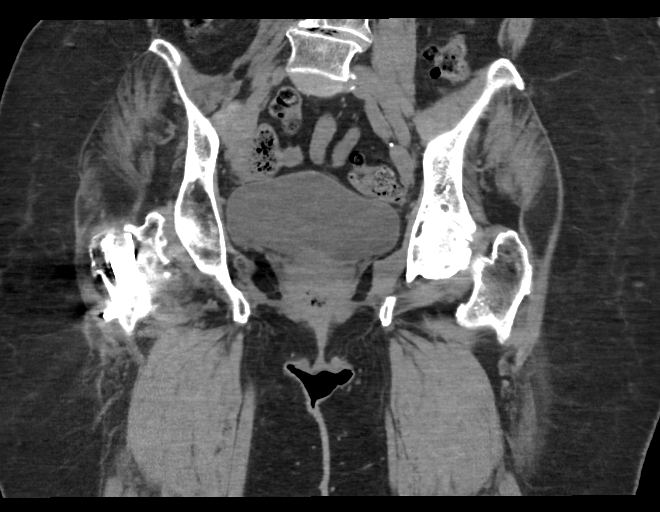

[16 of 46 positions shown; findings below may reference images not displayed]

FINDINGS: Urinary Tract:  Normal bladder.  Normal caliber pelvic ureters.

Bowel: Marked sigmoid diverticulosis. No dilated or thick-walled
pelvic bowel loops.

Vascular/Lymphatic: Mild bilateral iliac atherosclerosis. No
pathologically enlarged pelvic lymph nodes.

Reproductive: Status post hysterectomy, with no abnormal findings at
the vaginal cuff. No adnexal mass.

Other:  No pelvic ascites, pneumoperitoneum or fluid collections.

Musculoskeletal: No aggressive appearing focal osseous lesions.
Healed deformity in the intertrochanteric proximal right femur with
intramedullary rod in the proximal right femoral metadiaphysis with
interlocking right femoral neck pin and single distal interlocking
screw with no evidence of hardware fracture or loosening. No acute
osseous fracture in the pelvis. No hip dislocation. Moderate left
and mild right hip osteoarthritis. Degenerative changes at the pubic
symphysis and bilateral sacroiliac joints. Prominent bilateral lower
lumbar facet arthropathy.
IMPRESSION: 1. No acute osseous fracture in the pelvis.  No malalignment.
2. Healed deformity in the intertrochanteric proximal right femur
status post ORIF, with no evidence of hardware complication.
3. Moderate left and mild right hip osteoarthritis.
4. Marked sigmoid diverticulosis.

## 2023-03-21 ENCOUNTER — Other Ambulatory Visit: Payer: Self-pay

## 2023-03-21 ENCOUNTER — Emergency Department: Payer: Medicare Other

## 2023-03-21 DIAGNOSIS — E119 Type 2 diabetes mellitus without complications: Secondary | ICD-10-CM | POA: Diagnosis present

## 2023-03-21 DIAGNOSIS — N390 Urinary tract infection, site not specified: Principal | ICD-10-CM | POA: Diagnosis present

## 2023-03-21 DIAGNOSIS — K219 Gastro-esophageal reflux disease without esophagitis: Secondary | ICD-10-CM | POA: Diagnosis present

## 2023-03-21 DIAGNOSIS — E7800 Pure hypercholesterolemia, unspecified: Secondary | ICD-10-CM | POA: Diagnosis present

## 2023-03-21 DIAGNOSIS — I10 Essential (primary) hypertension: Secondary | ICD-10-CM | POA: Diagnosis present

## 2023-03-21 DIAGNOSIS — Z90710 Acquired absence of both cervix and uterus: Secondary | ICD-10-CM

## 2023-03-21 DIAGNOSIS — R262 Difficulty in walking, not elsewhere classified: Secondary | ICD-10-CM | POA: Diagnosis present

## 2023-03-21 DIAGNOSIS — E860 Dehydration: Secondary | ICD-10-CM | POA: Diagnosis present

## 2023-03-21 DIAGNOSIS — Z66 Do not resuscitate: Secondary | ICD-10-CM | POA: Diagnosis present

## 2023-03-21 DIAGNOSIS — Z885 Allergy status to narcotic agent status: Secondary | ICD-10-CM

## 2023-03-21 DIAGNOSIS — Z8673 Personal history of transient ischemic attack (TIA), and cerebral infarction without residual deficits: Secondary | ICD-10-CM

## 2023-03-21 DIAGNOSIS — Z87440 Personal history of urinary (tract) infections: Secondary | ICD-10-CM

## 2023-03-21 DIAGNOSIS — M810 Age-related osteoporosis without current pathological fracture: Secondary | ICD-10-CM | POA: Diagnosis present

## 2023-03-21 DIAGNOSIS — J189 Pneumonia, unspecified organism: Secondary | ICD-10-CM | POA: Diagnosis present

## 2023-03-21 DIAGNOSIS — Z7982 Long term (current) use of aspirin: Secondary | ICD-10-CM

## 2023-03-21 DIAGNOSIS — J984 Other disorders of lung: Secondary | ICD-10-CM | POA: Diagnosis present

## 2023-03-21 DIAGNOSIS — Z1152 Encounter for screening for COVID-19: Secondary | ICD-10-CM

## 2023-03-21 DIAGNOSIS — Z79899 Other long term (current) drug therapy: Secondary | ICD-10-CM

## 2023-03-21 DIAGNOSIS — Z888 Allergy status to other drugs, medicaments and biological substances status: Secondary | ICD-10-CM

## 2023-03-21 DIAGNOSIS — R531 Weakness: Secondary | ICD-10-CM | POA: Diagnosis not present

## 2023-03-21 DIAGNOSIS — B349 Viral infection, unspecified: Secondary | ICD-10-CM | POA: Diagnosis present

## 2023-03-21 LAB — CBC
HCT: 48.1 % — ABNORMAL HIGH (ref 36.0–46.0)
Hemoglobin: 15.2 g/dL — ABNORMAL HIGH (ref 12.0–15.0)
MCH: 32.5 pg (ref 26.0–34.0)
MCHC: 31.6 g/dL (ref 30.0–36.0)
MCV: 103 fL — ABNORMAL HIGH (ref 80.0–100.0)
Platelets: 173 10*3/uL (ref 150–400)
RBC: 4.67 MIL/uL (ref 3.87–5.11)
RDW: 13.7 % (ref 11.5–15.5)
WBC: 7.9 10*3/uL (ref 4.0–10.5)
nRBC: 0 % (ref 0.0–0.2)

## 2023-03-21 NOTE — ED Triage Notes (Signed)
Pt to ed from home via [facility] for generalized weakness. Pt has had several UTI over the last few months. Pt is currently on macrobid as well for same. Pt has no complaints at this time 98.6 97% 98 HR  122/52 BGL 173

## 2023-03-22 ENCOUNTER — Emergency Department: Payer: Medicare Other

## 2023-03-22 ENCOUNTER — Inpatient Hospital Stay
Admission: EM | Admit: 2023-03-22 | Discharge: 2023-03-26 | DRG: 689 | Disposition: A | Discharge status: Skilled Nursing Facility | Payer: Medicare Other | Attending: Student | Admitting: Student

## 2023-03-22 DIAGNOSIS — R531 Weakness: Principal | ICD-10-CM

## 2023-03-22 DIAGNOSIS — R262 Difficulty in walking, not elsewhere classified: Secondary | ICD-10-CM | POA: Diagnosis present

## 2023-03-22 DIAGNOSIS — M810 Age-related osteoporosis without current pathological fracture: Secondary | ICD-10-CM | POA: Diagnosis present

## 2023-03-22 DIAGNOSIS — E860 Dehydration: Secondary | ICD-10-CM | POA: Diagnosis present

## 2023-03-22 DIAGNOSIS — B349 Viral infection, unspecified: Secondary | ICD-10-CM | POA: Diagnosis present

## 2023-03-22 DIAGNOSIS — N390 Urinary tract infection, site not specified: Secondary | ICD-10-CM | POA: Diagnosis present

## 2023-03-22 DIAGNOSIS — Z90710 Acquired absence of both cervix and uterus: Secondary | ICD-10-CM | POA: Diagnosis not present

## 2023-03-22 DIAGNOSIS — E119 Type 2 diabetes mellitus without complications: Secondary | ICD-10-CM | POA: Diagnosis present

## 2023-03-22 DIAGNOSIS — Z87440 Personal history of urinary (tract) infections: Secondary | ICD-10-CM | POA: Diagnosis not present

## 2023-03-22 DIAGNOSIS — Z7982 Long term (current) use of aspirin: Secondary | ICD-10-CM | POA: Diagnosis not present

## 2023-03-22 DIAGNOSIS — J984 Other disorders of lung: Secondary | ICD-10-CM | POA: Diagnosis present

## 2023-03-22 DIAGNOSIS — Z888 Allergy status to other drugs, medicaments and biological substances status: Secondary | ICD-10-CM | POA: Diagnosis not present

## 2023-03-22 DIAGNOSIS — Z885 Allergy status to narcotic agent status: Secondary | ICD-10-CM | POA: Diagnosis not present

## 2023-03-22 DIAGNOSIS — K219 Gastro-esophageal reflux disease without esophagitis: Secondary | ICD-10-CM | POA: Diagnosis present

## 2023-03-22 DIAGNOSIS — J189 Pneumonia, unspecified organism: Secondary | ICD-10-CM

## 2023-03-22 DIAGNOSIS — I10 Essential (primary) hypertension: Secondary | ICD-10-CM | POA: Diagnosis present

## 2023-03-22 DIAGNOSIS — Z8673 Personal history of transient ischemic attack (TIA), and cerebral infarction without residual deficits: Secondary | ICD-10-CM | POA: Diagnosis not present

## 2023-03-22 DIAGNOSIS — Z79899 Other long term (current) drug therapy: Secondary | ICD-10-CM | POA: Diagnosis not present

## 2023-03-22 DIAGNOSIS — Z1152 Encounter for screening for COVID-19: Secondary | ICD-10-CM | POA: Diagnosis not present

## 2023-03-22 DIAGNOSIS — Z66 Do not resuscitate: Secondary | ICD-10-CM | POA: Diagnosis present

## 2023-03-22 DIAGNOSIS — E7800 Pure hypercholesterolemia, unspecified: Secondary | ICD-10-CM | POA: Diagnosis present

## 2023-03-22 LAB — PROCALCITONIN: Procalcitonin: <0.1 ng/mL

## 2023-03-22 LAB — BASIC METABOLIC PANEL
Anion gap: 13 (ref 5–15)
BUN: 21 mg/dL (ref 8–23)
CO2: 21 mmol/L — ABNORMAL LOW (ref 22–32)
Calcium: 9.3 mg/dL (ref 8.9–10.3)
Chloride: 101 mmol/L (ref 98–111)
Creatinine, Ser: 0.97 mg/dL (ref 0.44–1.00)
GFR, Estimated: 57 mL/min — ABNORMAL LOW (ref >60)
Glucose, Bld: 135 mg/dL — ABNORMAL HIGH (ref 70–99)
Potassium: 4.2 mmol/L (ref 3.5–5.1)
Sodium: 135 mmol/L (ref 135–145)

## 2023-03-22 LAB — URINALYSIS, ROUTINE W REFLEX MICROSCOPIC
Bilirubin Urine: NEGATIVE
Glucose, UA: NEGATIVE mg/dL
Hgb urine dipstick: NEGATIVE
Ketones, ur: NEGATIVE mg/dL
Nitrite: NEGATIVE
Protein, ur: NEGATIVE mg/dL
Specific Gravity, Urine: 1.016 (ref 1.005–1.030)
pH: 5 (ref 5.0–8.0)

## 2023-03-22 LAB — TROPONIN I (HIGH SENSITIVITY)
Troponin I (High Sensitivity): 12 ng/L (ref <18)
Troponin I (High Sensitivity): 12 ng/L (ref <18)

## 2023-03-22 LAB — SARS CORONAVIRUS 2 BY RT PCR: SARS Coronavirus 2 by RT PCR: NEGATIVE

## 2023-03-22 LAB — TSH: TSH: 1.613 u[IU]/mL (ref 0.350–4.500)

## 2023-03-22 MED ORDER — SODIUM CHLORIDE 0.9 % IV SOLN
2.0000 g | Freq: Once | INTRAVENOUS | Status: AC
  Administered 2023-03-22: 2 g via INTRAVENOUS
  Filled 2023-03-22: qty 20

## 2023-03-22 MED ORDER — LORAZEPAM 0.5 MG PO TABS: 0.5000 mg | ORAL_TABLET | Freq: Every evening | ORAL | Status: DC | PRN

## 2023-03-22 MED ORDER — ENOXAPARIN SODIUM 40 MG/0.4ML IJ SOSY
40.0000 mg | PREFILLED_SYRINGE | INTRAMUSCULAR | Status: DC
  Administered 2023-03-22 – 2023-03-26 (×5): 40 mg via SUBCUTANEOUS
  Filled 2023-03-22 (×6): qty 0.4

## 2023-03-22 MED ORDER — ASPIRIN 81 MG PO TBEC
81.0000 mg | DELAYED_RELEASE_TABLET | Freq: Every day | ORAL | Status: DC
  Administered 2023-03-22 – 2023-03-26 (×5): 81 mg via ORAL
  Filled 2023-03-22 (×5): qty 1

## 2023-03-22 MED ORDER — SODIUM CHLORIDE 0.9 % IV SOLN
500.0000 mg | Freq: Once | INTRAVENOUS | Status: AC
  Administered 2023-03-22: 500 mg via INTRAVENOUS
  Filled 2023-03-22: qty 5

## 2023-03-22 MED ORDER — ONDANSETRON HCL 4 MG/2ML IJ SOLN: 4.0000 mg | Freq: Four times a day (QID) | INTRAMUSCULAR | Status: DC | PRN

## 2023-03-22 MED ORDER — AMLODIPINE BESYLATE 5 MG PO TABS
5.0000 mg | ORAL_TABLET | Freq: Every day | ORAL | Status: DC
  Administered 2023-03-22: 5 mg via ORAL
  Filled 2023-03-22: qty 1

## 2023-03-22 MED ORDER — EZETIMIBE 10 MG PO TABS
10.0000 mg | ORAL_TABLET | Freq: Every day | ORAL | Status: DC
  Administered 2023-03-22 – 2023-03-25 (×4): 10 mg via ORAL
  Filled 2023-03-22 (×4): qty 1

## 2023-03-22 MED ORDER — GUAIFENESIN-DM 100-10 MG/5ML PO SYRP
5.0000 mL | ORAL_SOLUTION | ORAL | Status: DC | PRN
  Administered 2023-03-22 – 2023-03-25 (×2): 5 mL via ORAL
  Filled 2023-03-22 (×2): qty 10

## 2023-03-22 MED ORDER — ONDANSETRON HCL 4 MG PO TABS: 4.0000 mg | ORAL_TABLET | Freq: Four times a day (QID) | ORAL | Status: DC | PRN

## 2023-03-22 MED ORDER — SODIUM CHLORIDE 0.9 % IV SOLN
100.0000 mg | Freq: Two times a day (BID) | INTRAVENOUS | Status: DC
  Administered 2023-03-22 – 2023-03-24 (×4): 100 mg via INTRAVENOUS
  Filled 2023-03-22 (×5): qty 100

## 2023-03-22 MED ORDER — ATORVASTATIN CALCIUM 20 MG PO TABS
10.0000 mg | ORAL_TABLET | Freq: Every day | ORAL | Status: DC
  Administered 2023-03-22 – 2023-03-25 (×4): 10 mg via ORAL
  Filled 2023-03-22 (×4): qty 1

## 2023-03-22 MED ORDER — SODIUM CHLORIDE 0.9 % IV SOLN: INTRAVENOUS | Status: AC

## 2023-03-22 NOTE — ED Notes (Signed)
Pt diaper changed, peri care preformed.

## 2023-03-22 NOTE — ED Provider Notes (Signed)
[facility] Provider Note    Event Date/Time   First MD Initiated Contact with Patient 03/22/23 0230     (approximate)   History   Weakness   HPI  Joy Sanchez is a 88 y.o. female with history of hypertension, diabetes, hyperlipidemia who presents to the emergency department with generalized weakness.  Her sons are at the bedside who take care of her.  They report that she is normally able to get around with a walker but today she was so weak she was unable to walk without significant assistance.  No recent falls.  They do report she has had audible wheezing and has been coughing.  He is concerned that she could have pneumonia.  No history of CHF, asthma, COPD.  No known fevers.  They are also concerned that she could have a UTI as this also causes weakness for her and she has been on Macrobid.  No vomiting, diarrhea.  Patient denies any pain.   History provided by patient, signs.    Past Medical History:  Diagnosis Date   Diabetes mellitus without complication (HCC)    GERD (gastroesophageal reflux disease)    Hypercholesteremia    Hypertension    Osteoporosis     Past Surgical History:  Procedure Laterality Date   ABDOMINAL HYSTERECTOMY     APPENDECTOMY     KNEE SURGERY Bilateral     MEDICATIONS:  Prior to Admission medications   Medication Sig Start Date End Date Taking? Authorizing Provider  amLODipine (NORVASC) 5 MG tablet Take 5 mg by mouth daily.    [provider]  aspirin EC 81 MG tablet Take 81 mg by mouth daily.    [provider]  atorvastatin (LIPITOR) 10 MG tablet Take 10 mg by mouth daily.    [provider]  calcium citrate (CALCITRATE - DOSED IN MG ELEMENTAL CALCIUM) 950 MG tablet Take 200 mg of elemental calcium by mouth daily.    [provider]  diazepam (VALIUM) 2 MG tablet 1/2 to 1 tablet q 8 hours prn muscle spasms 11/11/20   Tommi Rumps, PA-C  ezetimibe (ZETIA) 10 MG tablet  Take 10 mg by mouth daily.    [provider]  gabapentin (NEURONTIN) 300 MG capsule Take 300 mg by mouth 3 (three) times daily.    [provider]  losartan-hydrochlorothiazide (HYZAAR) 100-25 MG tablet Take 1 tablet by mouth daily.    [provider]  Multiple Vitamin (MULTIVITAMIN) capsule Take 1 capsule by mouth daily.    [provider]    Physical Exam   Triage Vital Signs: ED Triage Vitals  Encounter Vitals Group     BP 03/21/23 2323 136/65     Systolic BP Percentile --      Diastolic BP Percentile --      Pulse Rate 03/21/23 2323 98     Resp 03/21/23 2323 18     Temp 03/21/23 2323 98.6 F (37 C)     Temp Source 03/21/23 2323 Oral     SpO2 03/21/23 2323 98 %     Weight --      Height 03/21/23 2324 5\' 2"  (1.575 m)     Head Circumference --      Peak Flow --      Pain Score 03/21/23 2323 0     Pain Loc --      Pain Education --      Exclude from Growth Chart --  Most recent vital signs: Vitals:   03/22/23 0700 03/22/23 0730  BP: (!) 103/59 116/73  Pulse: 80 78  Resp: 16 15  Temp: 98.2 F (36.8 C)   SpO2: 97% 98%    CONSTITUTIONAL: Alert, responds appropriately to questions.  Chronically ill-appearing. HEAD: Normocephalic, atraumatic EYES: Conjunctivae clear, pupils appear equal, sclera nonicteric ENT: normal nose; moist mucous membranes NECK: Supple, normal ROM CARD: RRR; S1 and S2 appreciated RESP: Normal chest excursion without splinting or tachypnea; breath sounds clear and equal bilaterally; no wheezes, no rhonchi, no rales, no hypoxia or respiratory distress, speaking full sentences ABD/GI: Non-distended; soft, non-tender, no rebound, no guarding, no peritoneal signs BACK: The back appears normal EXT: Normal ROM in all joints; no deformity noted, no edema SKIN: Normal color for age and race; warm; no rash on exposed skin NEURO: Moves all extremities equally, normal speech PSYCH: The patient's mood and manner are  appropriate.   ED Results / Procedures / Treatments   LABS: (all labs ordered are listed, but only abnormal results are displayed) Labs Reviewed  BASIC METABOLIC PANEL - Abnormal; Notable for the following components:      Result Value   CO2 21 (*)    Glucose, Bld 135 (*)    GFR, Estimated 57 (*)    All other components within normal limits  CBC - Abnormal; Notable for the following components:   Hemoglobin 15.2 (*)    HCT 48.1 (*)    MCV 103.0 (*)    All other components within normal limits  URINALYSIS, ROUTINE W REFLEX MICROSCOPIC - Abnormal; Notable for the following components:   Color, Urine YELLOW (*)    APPearance HAZY (*)    Leukocytes,Ua TRACE (*)    Bacteria, UA MANY (*)    All other components within normal limits  SARS CORONAVIRUS 2 BY RT PCR  PROCALCITONIN  TSH  TROPONIN I (HIGH SENSITIVITY)  TROPONIN I (HIGH SENSITIVITY)     EKG:  EKG Interpretation Date/Time:  Saturday March 21 2023 23:26:08 EDT Ventricular Rate:  95 PR Interval:  164 QRS Duration:  82 QT Interval:  356 QTC Calculation: 447 R Axis:   -46  Text Interpretation: **Poor data quality, interpretation may be adversely affected Sinus rhythm with Premature atrial complexes Left anterior fascicular block Moderate voltage criteria for LVH, may be normal variant ( R in aVL , Cornell product ) Abnormal ECG When compared with ECG of 21-Nov-2005 13:24, Premature atrial complexes are now Present QRS axis Shifted left Confirmed by Rochele Raring [phone]) on 03/22/2023 2:33:47 AM         RADIOLOGY: My personal review and interpretation of imaging: CT head showed no acute abnormality.  Chest x-ray and CT chest concerning for atelectasis versus development of left lower lobe pneumonia.  I have personally reviewed all radiology reports.   CT CHEST WO CONTRAST  Result Date: 03/22/2023 CLINICAL DATA:  88 year old female with generalized weakness, respiratory illness, recurrent UTIs. EXAM: CT CHEST  WITHOUT CONTRAST TECHNIQUE: Multidetector CT imaging of the chest was performed following the standard protocol without IV contrast. RADIATION DOSE REDUCTION: This exam was performed according to the departmental dose-optimization program which includes automated exposure control, adjustment of the mA and/or kV according to patient size and/or use of iterative reconstruction technique. COMPARISON:  Chest radiographs 03/21/2023. Prior chest CT 08/30/2004. FINDINGS: Cardiovascular: Calcified coronary artery, Calcified aortic atherosclerosis. Cardiac size has mildly increased since 2006, mild cardiomegaly. No pericardial effusion. Vascular patency is not evaluated in the absence of IV  contrast. Mediastinum/Nodes: Negative. No mediastinal mass or lymphadenopathy. Lungs/Pleura: Lower lung volumes compared to 2006 and atelectatic changes to the major airways. Bronchial wall thickening subsequently difficult to judge, but probably no abnormal airway thickening. Streaky bilateral lower lung opacity more resembles atelectasis than infection. No pleural effusion or consolidation. No evidence of pulmonary edema. No pulmonary mass. Upper Abdomen: Abdominal Calcified aortic atherosclerosis. Small calcified splenic granulomas are chronic. Punctate left nephrolithiasis. Negative visible noncontrast liver, gallbladder, pancreas, adrenal glands. Nondilated bowel and no free air or free fluid in the visible upper abdomen. Musculoskeletal: Chronic appearing mild T7 anterior wedge compression fracture. T8 superior endplate deformity is mild and may be a chronic Schmorl's node. Underlying generalized osteopenia. No acute or suspicious osseous lesion identified. IMPRESSION: 1. Lower lung volumes compared to a prior Chest CT with streaky lower lung opacity more resembling atelectasis than infection. No pleural effusion. 2. Mild cardiomegaly. Calcified coronary artery and Aortic Atherosclerosis (ICD10-I70.0). Left nephrolithiasis.  Electronically Signed   By: Odessa Fleming M.D.   On: 03/22/2023 04:17   CT HEAD WO CONTRAST ( )  Result Date: 03/22/2023 CLINICAL DATA:  88 year old female with weakness, mental status change. Recurrent UTIs. EXAM: CT HEAD WITHOUT CONTRAST TECHNIQUE: Contiguous axial images were obtained from the base of the skull through the vertex without intravenous contrast. RADIATION DOSE REDUCTION: This exam was performed according to the departmental dose-optimization program which includes automated exposure control, adjustment of the mA and/or kV according to patient size and/or use of iterative reconstruction technique. COMPARISON:  None Available. FINDINGS: Brain: Cerebral volume loss appears generalized. No midline shift, ventriculomegaly, mass effect, evidence of mass lesion, intracranial hemorrhage or evidence of cortically based acute infarction. Mild for age scattered cerebral white matter hypodensity. Otherwise preserved gray-white differentiation. No cortical encephalomalacia identified. Vascular: No suspicious intracranial vascular hyperdensity. Calcified atherosclerosis at the skull base. Skull: No acute osseous abnormality identified. Sinuses/Orbits: Visualized paranasal sinuses and mastoids are clear. Other: No acute orbit or scalp soft tissue finding. IMPRESSION: No acute intracranial abnormality. Largely unremarkable for age noncontrast Head CT. Electronically Signed   By: Odessa Fleming M.D.   On: 03/22/2023 04:11   DG Chest 2 View  Result Date: 03/21/2023 CLINICAL DATA:  Chest pain. EXAM: CHEST - 2 VIEW COMPARISON:  Chest radiograph dated 06/07/2009. FINDINGS: Shallow inspiration with minimal left lung base atelectasis. No focal consolidation, pleural effusion, or pneumothorax. Stable cardiac silhouette. Atherosclerotic calcification of the aorta. Degenerative changes of the spine. No acute osseous pathology. IMPRESSION: Shallow inspiration with minimal left lung base atelectasis. Electronically Signed   By:  Elgie Collard M.D.   On: 03/21/2023 23:48     PROCEDURES:  Critical Care performed: No      .1-3 Lead EKG Interpretation  Performed by: Karyna Bessler, Layla Maw, DO Authorized by: Daphanie Oquendo, Layla Maw, DO     Interpretation: abnormal     ECG rate:  78   ECG rate assessment: normal     Rhythm: sinus rhythm     Ectopy: PAC     Conduction: normal       IMPRESSION / MDM / ASSESSMENT AND PLAN / ED COURSE  I reviewed the triage vital signs and the nursing notes.    Patient here with general weakness, cough and wheezing  The patient is on the cardiac monitor to evaluate for evidence of arrhythmia and/or significant heart rate changes.   DIFFERENTIAL DIAGNOSIS (includes but not limited to):   Pneumonia, COVID, other viral URI, bronchitis, UTI, dehydration, anemia, electrolyte derangement, stroke  Patient's presentation is most consistent with acute presentation with potential threat to life or bodily function.   PLAN: Will obtain labs, urine, CT head, CT chest.  Chest x-ray reviewed and interpreted by myself and the radiologist as concerning for developing left lower lobe pneumonia.  Lungs currently clear to auscultation.  No hypoxia.   MEDICATIONS GIVEN IN ED: Medications  cefTRIAXone (ROCEPHIN) 2 g in sodium chloride 0.9 % 100 mL IVPB (0 g Intravenous Stopped 03/22/23 0520)  azithromycin (ZITHROMAX) 500 mg in sodium chloride 0.9 % 250 mL IVPB (0 mg Intravenous Stopped 03/22/23 2130)     ED COURSE: CT head reviewed and interpreted by myself and the radiologist and shows no acute abnormality.  CT chest concerning for pneumonia.  Labs show no leukocytosis.  Negative troponin.  Urine does show white blood cells and many bacteria with no squamous cells.  Culture pending.  COVID-negative.  Will cover for community-acquired pneumonia as well as UTI.  Will discuss with the hospitalist for admission.   CONSULTS:  Consulted and discussed patient's case with hospitalist, Dr. Arville Care.  I have  recommended admission and consulting physician agrees and will place admission orders.  Patient (and family if present) agree with this plan.   I reviewed all nursing notes, vitals, pertinent previous records.  All labs, EKGs, imaging ordered have been independently reviewed and interpreted by myself.    OUTSIDE RECORDS REVIEWED: Reviewed previous [facility] internal medicine notes.       FINAL CLINICAL IMPRESSION(S) / ED DIAGNOSES   Final diagnoses:  Generalized weakness  Acute UTI  Community acquired pneumonia of left lower lobe of lung     Rx / DC Orders   ED Discharge Orders     None        Note:  This document was prepared using Dragon voice recognition software and may include unintentional dictation errors.   Rhiley Solem, Layla Maw, DO 03/22/23 0800

## 2023-03-22 NOTE — ED Notes (Signed)
Pt has been cleaned up a few times, new brief placed on

## 2023-03-22 NOTE — ED Notes (Signed)
Used a small female cath at this time to retrieve the urine sample. Pt tolerated well.

## 2023-03-22 NOTE — H&P (Signed)
History and Physical    Joy Sanchez WUJ:[id number] DOB: 1935-04-01 DOA: 03/22/2023  PCP: Gracelyn Nurse, MD (Confirm with patient/family/NH records and if not entered, this has to be entered at [facility] point of entry) Patient coming from: Home  I have personally briefly reviewed patient's old medical records in [facility]  Chief Complaint: Feeling weak  HPI: Joy Sanchez is a 88 y.o. female with medical history significant of HTN, HLD, IIDM, recurrent UTIs, osteoporosis, presented with UTI symptoms and generalized weakness.  Patient has had multiple UTIs since May this year and treated with antibiotics.  Last week, patient developed again UTI symptoms including urinary frequency and burning sensation and again was diagnosed with UTI and urine culture showed Citrobacter and patient has been treated for last 3 days of Macrobid 2 times a day with no significant improvement.  Since yesterday patient has been feeling generalized weakness, she stayed in the recliner whole day.  Denies any fever chills no cough no chest pains or abdominal pains.  ED Course: Afebrile, no tachycardia no hypotension nonhypoxic.  UA showed WBC 11-20, many bacteria, chest x-ray suspicious for atelectasis versus pneumonia.  WBC 7.9, hemoglobin 15, creatinine 0.9.  Patient was given ceftriaxone and azithromycin  Review of Systems: As per HPI otherwise 14 point review of systems negative.    Past Medical History:  Diagnosis Date   Diabetes mellitus without complication (HCC)    GERD (gastroesophageal reflux disease)    Hypercholesteremia    Hypertension    Osteoporosis     Past Surgical History:  Procedure Laterality Date   ABDOMINAL HYSTERECTOMY     APPENDECTOMY     KNEE SURGERY Bilateral      reports that she has never smoked. She has never used smokeless tobacco. She reports that she does not drink alcohol. No history on file for drug use.  Allergies  Allergen Reactions    Bisphosphonates Nausea Only   Codeine Nausea Only    Family History  Problem Relation Age of Onset   Breast cancer Mother        72's     Prior to Admission medications   Medication Sig Start Date End Date Taking? Authorizing Provider  amLODipine (NORVASC) 5 MG tablet Take 5 mg by mouth daily.   Yes [provider]  aspirin EC 81 MG tablet Take 81 mg by mouth daily.   Yes [provider]  atorvastatin (LIPITOR) 10 MG tablet Take 10 mg by mouth daily.   Yes [provider]  calcium citrate (CALCITRATE - DOSED IN MG ELEMENTAL CALCIUM) 950 MG tablet Take 200 mg of elemental calcium by mouth daily.   Yes [provider]  ezetimibe (ZETIA) 10 MG tablet Take 10 mg by mouth daily.   Yes [provider]  LORazepam (ATIVAN) 0.5 MG tablet Take 0.5 mg by mouth at bedtime as needed.   Yes [provider]  Multiple Vitamin (MULTIVITAMIN) capsule Take 1 capsule by mouth daily.   Yes [provider]  diazepam (VALIUM) 2 MG tablet 1/2 to 1 tablet q 8 hours prn muscle spasms Patient not taking: Reported on 03/22/2023 11/11/20   Tommi Rumps, PA-C  gabapentin (NEURONTIN) 300 MG capsule Take 300 mg by mouth 3 (three) times daily. Patient not taking: Reported on 03/22/2023    [provider]  losartan-hydrochlorothiazide (HYZAAR) 100-25 MG tablet Take 1 tablet by mouth daily. Patient not taking: Reported on 03/22/2023    [provider]  Physical Exam: Vitals:   03/22/23 0630 03/22/23 0700 03/22/23 0730 03/22/23 0844  BP: (!) 108/57 (!) 103/59 116/73 (!) 126/53  Pulse: 76 80 78 65  Resp: 18 16 15 17   Temp:  98.2 F (36.8 C)  98 F (36.7 C)  TempSrc:    Oral  SpO2: 97% 97% 98% 97%  Height:        Constitutional: NAD, calm, comfortable Vitals:   03/22/23 0630 03/22/23 0700 03/22/23 0730 03/22/23 0844  BP: (!) 108/57 (!) 103/59 116/73 (!) 126/53  Pulse: 76 80 78 65  Resp: 18 16 15 17   Temp:  98.2 F (36.8 C)   98 F (36.7 C)  TempSrc:    Oral  SpO2: 97% 97% 98% 97%  Height:       Eyes: PERRL, lids and conjunctivae normal ENMT: Mucous membranes are dry. Posterior pharynx clear of any exudate or lesions.Normal dentition.  Neck: normal, supple, no masses, no thyromegaly Respiratory: clear to auscultation bilaterally, no wheezing, no crackles. Normal respiratory effort. No accessory muscle use.  Cardiovascular: Regular rate and rhythm, no murmurs / rubs / gallops. No extremity edema. 2+ pedal pulses. No carotid bruits.  Abdomen: no tenderness, no masses palpated. No hepatosplenomegaly. Bowel sounds positive.  Musculoskeletal: no clubbing / cyanosis. No joint deformity upper and lower extremities. Good ROM, no contractures. Normal muscle tone.  Skin: no rashes, lesions, ulcers. No induration Neurologic: CN 2-12 grossly intact. Sensation intact, DTR normal. Strength 5/5 in all 4.  Psychiatric: Normal judgment and insight. Alert and oriented x 3. Normal mood.     Labs on Admission: I have personally reviewed following labs and imaging studies  CBC: Recent Labs  Lab 03/21/23 2330  WBC 7.9  HGB 15.2*  HCT 48.1*  MCV 103.0*  PLT 173   Basic Metabolic Panel: Recent Labs  Lab 03/21/23 2330  NA 135  K 4.2  CL 101  CO2 21*  GLUCOSE 135*  BUN 21  CREATININE 0.97  CALCIUM 9.3   GFR: CrCl cannot be calculated (Unknown ideal weight.). Liver Function Tests: No results for input(s): "AST", "ALT", "ALKPHOS", "BILITOT", "PROT", "ALBUMIN" in the last 168 hours. No results for input(s): "LIPASE", "AMYLASE" in the last 168 hours. No results for input(s): "AMMONIA" in the last 168 hours. Coagulation Profile: No results for input(s): "INR", "PROTIME" in the last 168 hours. Cardiac Enzymes: No results for input(s): "CKTOTAL", "CKMB", "CKMBINDEX", "TROPONINI" in the last 168 hours. BNP (last 3 results) No results for input(s): "PROBNP" in the last 8760 hours. HbA1C: No results for input(s):  "HGBA1C" in the last 72 hours. CBG: No results for input(s): "GLUCAP" in the last 168 hours. Lipid Profile: No results for input(s): "CHOL", "HDL", "LDLCALC", "TRIG", "CHOLHDL", "LDLDIRECT" in the last 72 hours. Thyroid Function Tests: No results for input(s): "TSH", "T4TOTAL", "FREET4", "T3FREE", "THYROIDAB" in the last 72 hours. Anemia Panel: No results for input(s): "VITAMINB12", "FOLATE", "FERRITIN", "TIBC", "IRON", "RETICCTPCT" in the last 72 hours. Urine analysis:    Component Value Date/Time   COLORURINE YELLOW (A) 03/22/2023 0418   APPEARANCEUR HAZY (A) 03/22/2023 0418   LABSPEC 1.016 03/22/2023 0418   PHURINE 5.0 03/22/2023 0418   GLUCOSEU NEGATIVE 03/22/2023 0418   HGBUR NEGATIVE 03/22/2023 0418   BILIRUBINUR NEGATIVE 03/22/2023 0418   KETONESUR NEGATIVE 03/22/2023 0418   PROTEINUR NEGATIVE 03/22/2023 0418   NITRITE NEGATIVE 03/22/2023 0418   LEUKOCYTESUR TRACE (A) 03/22/2023 0418    Radiological Exams on Admission: CT CHEST WO CONTRAST  Result Date: 03/22/2023 CLINICAL  DATA:  88 year old female with generalized weakness, respiratory illness, recurrent UTIs. EXAM: CT CHEST WITHOUT CONTRAST TECHNIQUE: Multidetector CT imaging of the chest was performed following the standard protocol without IV contrast. RADIATION DOSE REDUCTION: This exam was performed according to the departmental dose-optimization program which includes automated exposure control, adjustment of the mA and/or kV according to patient size and/or use of iterative reconstruction technique. COMPARISON:  Chest radiographs 03/21/2023. Prior chest CT 08/30/2004. FINDINGS: Cardiovascular: Calcified coronary artery, Calcified aortic atherosclerosis. Cardiac size has mildly increased since 2006, mild cardiomegaly. No pericardial effusion. Vascular patency is not evaluated in the absence of IV contrast. Mediastinum/Nodes: Negative. No mediastinal mass or lymphadenopathy. Lungs/Pleura: Lower lung volumes compared to 2006  and atelectatic changes to the major airways. Bronchial wall thickening subsequently difficult to judge, but probably no abnormal airway thickening. Streaky bilateral lower lung opacity more resembles atelectasis than infection. No pleural effusion or consolidation. No evidence of pulmonary edema. No pulmonary mass. Upper Abdomen: Abdominal Calcified aortic atherosclerosis. Small calcified splenic granulomas are chronic. Punctate left nephrolithiasis. Negative visible noncontrast liver, gallbladder, pancreas, adrenal glands. Nondilated bowel and no free air or free fluid in the visible upper abdomen. Musculoskeletal: Chronic appearing mild T7 anterior wedge compression fracture. T8 superior endplate deformity is mild and may be a chronic Schmorl's node. Underlying generalized osteopenia. No acute or suspicious osseous lesion identified. IMPRESSION: 1. Lower lung volumes compared to a prior Chest CT with streaky lower lung opacity more resembling atelectasis than infection. No pleural effusion. 2. Mild cardiomegaly. Calcified coronary artery and Aortic Atherosclerosis (ICD10-I70.0). Left nephrolithiasis. Electronically Signed   By: Odessa Fleming M.D.   On: 03/22/2023 04:17   CT HEAD WO CONTRAST ( )  Result Date: 03/22/2023 CLINICAL DATA:  88 year old female with weakness, mental status change. Recurrent UTIs. EXAM: CT HEAD WITHOUT CONTRAST TECHNIQUE: Contiguous axial images were obtained from the base of the skull through the vertex without intravenous contrast. RADIATION DOSE REDUCTION: This exam was performed according to the departmental dose-optimization program which includes automated exposure control, adjustment of the mA and/or kV according to patient size and/or use of iterative reconstruction technique. COMPARISON:  None Available. FINDINGS: Brain: Cerebral volume loss appears generalized. No midline shift, ventriculomegaly, mass effect, evidence of mass lesion, intracranial hemorrhage or evidence of  cortically based acute infarction. Mild for age scattered cerebral white matter hypodensity. Otherwise preserved gray-white differentiation. No cortical encephalomalacia identified. Vascular: No suspicious intracranial vascular hyperdensity. Calcified atherosclerosis at the skull base. Skull: No acute osseous abnormality identified. Sinuses/Orbits: Visualized paranasal sinuses and mastoids are clear. Other: No acute orbit or scalp soft tissue finding. IMPRESSION: No acute intracranial abnormality. Largely unremarkable for age noncontrast Head CT. Electronically Signed   By: Odessa Fleming M.D.   On: 03/22/2023 04:11   DG Chest 2 View  Result Date: 03/21/2023 CLINICAL DATA:  Chest pain. EXAM: CHEST - 2 VIEW COMPARISON:  Chest radiograph dated 06/07/2009. FINDINGS: Shallow inspiration with minimal left lung base atelectasis. No focal consolidation, pleural effusion, or pneumothorax. Stable cardiac silhouette. Atherosclerotic calcification of the aorta. Degenerative changes of the spine. No acute osseous pathology. IMPRESSION: Shallow inspiration with minimal left lung base atelectasis. Electronically Signed   By: Elgie Collard M.D.   On: 03/21/2023 23:48    EKG: Independently reviewed.  Sinus arrhythmia, LVH, nonspecific ST changes on multiple leads  Assessment/Plan Principal Problem:   Generalized weakness Active Problems:   UTI (urinary tract infection)  (please populate well all problems here in Problem List. (For example, if patient is  on BP meds at home and you resume or decide to hold them, it is a problem that needs to be her. Same for CAD, COPD, HLD and so on)  Recurrent UTI, failed outpatient management -Review of most recent 2 UTIs, Citrobacter in August and Klebsiella/E. coli pansensitive in June and July, will use doxycycline to cover both UTI and possible pneumonia. -Discussed with family regarding future suppressive antibiotic treatment  Question of pneumonia vs atelectasis -Versus  atelectasis, management as above, repeat x-ray tomorrow -Incentive telemetry  Generalized weakness Impaired ambulation -Secondary to UTI, management as above -PT OT evaluation  Dehydration -Clinically appears to be dry and blood work showed hemoconcentration -Likely secondary to UTI -IV fluid x 12 hours then reevaluate  HTN -Controlled, continue amlodipine  DVT prophylaxis: Lovenox Code Status: DNR Family Communication: Son at bedside Disposition Plan: Patient is sick with UTI recurrent failed outpatient management, requiring IV antibiotics, expect more than 2 midnight hospital stay Consults called: None Admission status: MedSurg admission   Emeline General MD [facility] Pager [phone]  03/22/2023, 9:06 AM

## 2023-03-22 NOTE — ED Notes (Signed)
Advised nurse that patient has ready bed 

## 2023-03-22 NOTE — Evaluation (Signed)
Physical Therapy Evaluation Patient Details Name: Joy Sanchez MRN: [id number] DOB: October 07, 1934 Today's Date: 03/22/2023  History of Present Illness  Pt is an 88 y/o female admitted secondary to generalized weakness. Pt with recurrent UTIs (failed outpatient management) and presenting with dehydration. Further work-up still pending (PNA vs atelectesis). PMH including but not limited to HTN, HLD, IIDM, recurrent UTIs, osteoporosis.   Clinical Impression  Pt presented supine in bed with HOB elevated, awake and willing to participate in therapy session. Pt's son (primary caregiver) was present throughout the evaluation. Prior to admission, pt reported that she ambulated with use of a RW and requires assistance from her son for bathing and dressing. Pt lives with her son in a single level home with a ramped entrance. At the time of evaluation, pt was very limited with functional mobility secondary to generalized weakness and requiring physical assistance with all functional mobility. She required mod A for bed mobility and min A x2 for transfers. She was able to take side steps at EOB with min A x2 but unable to actually clear either foot from the floor with steps. Pt's son in agreement that pt would greatly benefit from short-term rehab at a SNF prior to returning home with him. Pt would continue to benefit from skilled physical therapy services at this time while admitted and after d/c to address the below listed limitations in order to improve overall safety and independence with functional mobility.       If plan is discharge home, recommend the following: A lot of help with walking and/or transfers;A lot of help with bathing/dressing/bathroom;Assistance with cooking/housework;Assistance with feeding;Assist for transportation;Help with stairs or ramp for entrance;Supervision due to cognitive status   Can travel by private vehicle   No    Equipment Recommendations None recommended by PT   Recommendations for Other Services       Functional Status Assessment Patient has had a recent decline in their functional status and demonstrates the ability to make significant improvements in function in a reasonable and predictable amount of time.     Precautions / Restrictions Precautions Precautions: Fall Restrictions Weight Bearing Restrictions: No      Mobility  Bed Mobility Overal bed mobility: Needs Assistance Bed Mobility: Supine to Sit, Sit to Supine     Supine to sit: Mod assist Sit to supine: Mod assist   General bed mobility comments: increased time and effort, cueing for sequencing, assistance needed to manage LEs off of and back onto bed, assistance needed for trunk elevation to achieve an upright sitting position at EOB    Transfers Overall transfer level: Needs assistance Equipment used: 2 person hand held assist Transfers: Sit to/from Stand Sit to Stand: Min assist, +2 safety/equipment, +2 physical assistance           General transfer comment: increased time and effort, 2HHA (PT and son), pt with slow, steady rise into standing with assist to power up from EOB    Ambulation/Gait Ambulation/Gait assistance: Min assist, +2 physical assistance, +2 safety/equipment   Assistive device: 2 person hand held assist         General Gait Details: pt able to take 4-5 very small side steps at EOB with 2HHA and min A x2 for stability, cueing for sequencing; pt with zero foot clearance bilaterally  Stairs            Wheelchair Mobility     Tilt Bed    Modified Rankin (Stroke Patients Only)  Balance Overall balance assessment: Needs assistance Sitting-balance support: Feet supported Sitting balance-[name]: Poor Sitting balance - Comments: pt initially able to sit upright at EOB with CGA; however, with fatigue (after ~10 mins) pt with posterior lean and requiring min-mod A to sit upright while eating her breakfast Postural control:  Posterior lean Standing balance support: Bilateral upper extremity supported Standing balance-[name]: Poor                               Pertinent Vitals/Pain Pain Assessment Pain Assessment: Faces Faces Pain Scale: Hurts little more Pain Location: headache Pain Descriptors / Indicators: Headache Pain Intervention(s): Monitored during session, Repositioned    Home Living Family/patient expects to be discharged to:: Private residence Living Arrangements: Children Available Help at Discharge: Family;Available PRN/intermittently Type of Home: House Home Access: Ramped entrance       Home Layout: One level Home Equipment: Agricultural consultant (2 wheels);Rollator (4 wheels);Tub bench;Lift chair      Prior Function Prior Level of Function : Needs assist             Mobility Comments: pt ambulates with use of RW, sleeps in her lift chair ADLs Comments: requires assistance from son for dressing and bathing     Extremity/Trunk Assessment   Upper Extremity Assessment Upper Extremity Assessment: Defer to OT evaluation;Generalized weakness;Difficult to assess due to impaired cognition    Lower Extremity Assessment Lower Extremity Assessment: Generalized weakness;Difficult to assess due to impaired cognition    Cervical / Trunk Assessment Cervical / Trunk Assessment: Kyphotic  Communication   Communication Communication: Difficulty communicating thoughts/reduced clarity of speech;Difficulty following commands/understanding Following commands: Follows one step commands with increased time;Follows one step commands inconsistently Cueing Techniques: Verbal cues;Gestural cues;Tactile cues  Cognition Arousal: Alert Behavior During Therapy: Flat affect Overall Cognitive Status: Impaired/Different from baseline Area of Impairment: Attention, Memory, Following commands, Safety/judgement, Awareness, Problem solving                   Current Attention Level:  Sustained Memory: Decreased short-term memory Following Commands: Follows one step commands inconsistently, Follows one step commands with increased time Safety/Judgement: Decreased awareness of deficits, Decreased awareness of safety Awareness: Intellectual Problem Solving: Slow processing, Decreased initiation, Difficulty sequencing, Requires verbal cues, Requires tactile cues          General Comments      Exercises     Assessment/Plan    PT Assessment Patient needs continued PT services  PT Problem List Decreased strength;Decreased activity tolerance;Decreased balance;Decreased mobility;Decreased coordination;Decreased knowledge of use of DME;Decreased safety awareness;Decreased cognition;Decreased knowledge of precautions       PT Treatment Interventions DME instruction;Gait training;Stair training;Functional mobility training;Therapeutic activities;Therapeutic exercise;Balance training;Neuromuscular re-education;Patient/family education    PT Goals (Current goals can be found in the Care Plan section)  Acute Rehab PT Goals Patient Stated Goal: to get stronger and be more active PT Goal Formulation: With patient/family Time For Goal Achievement: 04/05/23 Potential to Achieve Goals: Good    Frequency Min 1X/week     Co-evaluation               AM-PAC PT "6 Clicks" Mobility  Outcome Measure Help needed turning from your back to your side while in a flat bed without using bedrails?: A Little Help needed moving from lying on your back to sitting on the side of a flat bed without using bedrails?: A Little Help needed moving to and from a  bed to a chair (including a wheelchair)?: A Lot Help needed standing up from a chair using your arms (e.g., wheelchair or bedside chair)?: A Little Help needed to walk in hospital room?: A Lot Help needed climbing 3-5 steps with a railing? : Total 6 Click Score: 14    End of Session   Activity Tolerance: Patient tolerated  treatment well Patient left: in bed;with call bell/phone within reach;with bed alarm set;with family/visitor present Nurse Communication: Mobility status PT Visit Diagnosis: Other abnormalities of gait and mobility (R26.89)    Time: 2841-3244 PT Time Calculation (min) (ACUTE ONLY): 32 min   Charges:   PT Evaluation $PT Eval Moderate Complexity: 1 Mod PT Treatments $Therapeutic Activity: 8-22 mins PT General Charges $$ ACUTE PT VISIT: 1 Visit         Arletta Bale, DPT  Acute Rehabilitation Services Office [phone]   Alessandra Bevels Jene Oravec 03/22/2023, 10:03 AM

## 2023-03-23 ENCOUNTER — Inpatient Hospital Stay: Payer: Medicare Other

## 2023-03-23 ENCOUNTER — Inpatient Hospital Stay
Admit: 2023-03-23 | Discharge: 2023-03-23 | Disposition: A | Discharge status: Home / Self Care | Payer: Medicare Other | Attending: Student | Admitting: Student

## 2023-03-23 DIAGNOSIS — R531 Weakness: Secondary | ICD-10-CM | POA: Diagnosis not present

## 2023-03-23 LAB — PHOSPHORUS: Phosphorus: 2.6 mg/dL (ref 2.5–4.6)

## 2023-03-23 LAB — BASIC METABOLIC PANEL
Anion gap: 7 (ref 5–15)
BUN: 19 mg/dL (ref 8–23)
CO2: 25 mmol/L (ref 22–32)
Calcium: 8.7 mg/dL — ABNORMAL LOW (ref 8.9–10.3)
Chloride: 107 mmol/L (ref 98–111)
Creatinine, Ser: 0.8 mg/dL (ref 0.44–1.00)
GFR, Estimated: >60 mL/min (ref >60)
Glucose, Bld: 93 mg/dL (ref 70–99)
Potassium: 4.3 mmol/L (ref 3.5–5.1)
Sodium: 139 mmol/L (ref 135–145)

## 2023-03-23 LAB — CBC
HCT: 39 % (ref 36.0–46.0)
Hemoglobin: 13.1 g/dL (ref 12.0–15.0)
MCH: 32.1 pg (ref 26.0–34.0)
MCHC: 33.6 g/dL (ref 30.0–36.0)
MCV: 95.6 fL (ref 80.0–100.0)
Platelets: 150 10*3/uL (ref 150–400)
RBC: 4.08 MIL/uL (ref 3.87–5.11)
RDW: 14 % (ref 11.5–15.5)
WBC: 6 10*3/uL (ref 4.0–10.5)
nRBC: 0 % (ref 0.0–0.2)

## 2023-03-23 LAB — MAGNESIUM: Magnesium: 1.8 mg/dL (ref 1.7–2.4)

## 2023-03-23 LAB — LIPID PANEL
Cholesterol: 99 mg/dL (ref 0–200)
HDL: 54 mg/dL (ref >40)
LDL Cholesterol: 31 mg/dL (ref 0–99)
Total CHOL/HDL Ratio: 1.8 RATIO
Triglycerides: 68 mg/dL (ref <150)
VLDL: 14 mg/dL (ref 0–40)

## 2023-03-23 LAB — VITAMIN B12: Vitamin B-12: 383 pg/mL (ref 180–914)

## 2023-03-23 LAB — ECHOCARDIOGRAM COMPLETE BUBBLE STUDY
AR max vel: 1.93 cm2
AV Area VTI: 1.87 cm2
AV Area mean vel: 1.63 cm2
AV Mean grad: 8 mmHg
AV Peak grad: 12.6 mmHg
Ao pk vel: 1.77 m/s
Area-P 1/2: 3.12 cm2
MV VTI: 2.1 cm2
S' Lateral: 2.6 cm

## 2023-03-23 LAB — FOLATE: Folate: 30 ng/mL (ref >5.9)

## 2023-03-23 LAB — VITAMIN D 25 HYDROXY (VIT D DEFICIENCY, FRACTURES): Vit D, 25-Hydroxy: 55.99 ng/mL (ref 30–100)

## 2023-03-23 LAB — HEMOGLOBIN A1C
Hgb A1c MFr Bld: 6.1 % — ABNORMAL HIGH (ref 4.8–5.6)
Mean Plasma Glucose: 128 mg/dL

## 2023-03-23 LAB — GLUCOSE, CAPILLARY: Glucose-Capillary: 127 mg/dL — ABNORMAL HIGH (ref 70–99)

## 2023-03-23 LAB — TSH: TSH: 2.548 u[IU]/mL (ref 0.350–4.500)

## 2023-03-23 MED ORDER — CYANOCOBALAMIN 500 MCG PO TABS
500.0000 ug | ORAL_TABLET | Freq: Every day | ORAL | Status: DC
  Administered 2023-03-23 – 2023-03-26 (×4): 500 ug via ORAL
  Filled 2023-03-23 (×4): qty 1

## 2023-03-23 MED ORDER — POLYVINYL ALCOHOL 1.4 % OP SOLN
1.0000 [drp] | OPHTHALMIC | Status: DC | PRN
  Administered 2023-03-24 (×2): 1 [drp] via OPHTHALMIC
  Filled 2023-03-23: qty 15

## 2023-03-23 MED ORDER — AMLODIPINE BESYLATE 5 MG PO TABS: 5.0000 mg | ORAL_TABLET | Freq: Every day | ORAL | Status: DC

## 2023-03-23 NOTE — Progress Notes (Signed)
*  PRELIMINARY RESULTS* Echocardiogram 2D Echocardiogram has been performed.  Joy Sanchez 03/23/2023, 2:41 PM

## 2023-03-23 NOTE — Progress Notes (Signed)
   03/23/23 1300  Spiritual Encounters  Type of Visit Initial  Care provided to: Pt and family  Conversation partners present during encounter Nurse  Referral source Code page  Reason for visit Code  OnCall Visit Yes  Interventions  Spiritual Care Interventions Made Mindfulness intervention  Intervention Outcomes  Outcomes Connection to spiritual care;Reduced isolation  Spiritual Care Plan  Spiritual Care Issues Still Outstanding No further spiritual care needs at this time (see row info)   Chap responded to code Stroke. Pt is currently being attended by medical team at this time. No further spiritual or emotional needs expressed at this time.

## 2023-03-23 NOTE — Code Documentation (Addendum)
Stroke Response Nurse Documentation Code Documentation  Joy Sanchez is a 88 y.o. female admitted to [facility] on 03/22/2023 for UTI with past medical hx of DM, HTN, GERD, hypercholesterolemia, right hip replacement. On aspirin 81 mg daily. Code stroke was activated by 1C staff.   Patient on 1C unit where she was LKW 2 months ago and now complaining of right sided weakness. Therapy was working with patient this morning and noticed right sided weakness. Code stroke activated with LKW/symptom onset of right sided weakness initially thought to be new as of this morning.  Stroke team at the bedside after patient activation. Patient to CT with team. NIHSS 7, see documentation for details and code stroke times. Patient with disoriented, right facial droop, right leg weakness, and dysarthria  on exam. Code stroke cancelled. Patient is not a candidate for IV Thrombolytic due to outside window, per MD.    Care/Plan: Code stroke cancelled at 1000. Every 4 hour NIHSS ordered.   Bedside handoff with RN Rosaria Ferries  Stroke Response RN

## 2023-03-23 NOTE — Plan of Care (Signed)

## 2023-03-23 NOTE — Consult Note (Signed)
Neurology Consultation Reason for Consult: Right sided weakness Referring Physician: Lucianne Muss, D  CC: Right sided weakness  History is obtained from:Patient, son  HPI: Joy Sanchez is a 88 y.o. female with a history of htn, hpl, DM who presented with UTI, with symptoms of generalized weakness and urinary symptoms.  She was admitted and started on antibiotics for this.  This morning, when Occupational Therapy was evaluating her, they noticed right-sided weakness that was not documented in the H&P and therefore a code stroke was activated.  On my responding to the code stroke, I asked the patient when her symptoms started and she said 2 months ago, they are no worse today, she just happened to mention it today when she has not mentioned it before.  She is very clear that symptoms have been present for several months, and she has had no acute changes since that time.   LKW: 2 months ago tnk given?: no, outside window    Past Medical History:  Diagnosis Date   Diabetes mellitus without complication (HCC)    GERD (gastroesophageal reflux disease)    Hypercholesteremia    Hypertension    Osteoporosis      Family History  Problem Relation Age of Onset   Breast cancer Mother        47's     Social History:  reports that she has never smoked. She has never used smokeless tobacco. She reports that she does not drink alcohol. No history on file for drug use.   Exam: Current vital signs: BP (!) 121/49 (BP Location: Right Arm)   Pulse (!) 58   Temp 98.1 F (36.7 C)   Resp 14   Ht 5\' 2"  (1.575 m)   SpO2 95%   BMI 31.46 kg/m  Vital signs in last 24 hours: Temp:  [98.1 F (36.7 C)-98.6 F (37 C)] 98.1 F (36.7 C) (08/12 0844) Pulse Rate:  [58-72] 58 (08/12 0844) Resp:  [14-18] 14 (08/12 0844) BP: (103-121)/(47-50) 121/49 (08/12 0844) SpO2:  [94 %-96 %] 95 % (08/12 0844)   Physical Exam  Appears well-developed and well-nourished.   Neuro: Mental Status: Patient is  awake, alert, oriented to person, place, month(initially states November, but then corrects her self to August), year, and situation. Patient is able to give a clear and coherent history. No signs of aphasia or neglect Cranial Nerves: II: Visual Fields are full. Pupils are equal, round, and reactive to light.   III,IV, VI: EOMI without ptosis or diploplia.  V: Facial sensation is symmetric to temperature VII: Facial movement with mild right facial weakness VIII: hearing is intact to voice X: Uvula elevates symmetrically XI: Shoulder shrug is symmetric. XII: tongue is midline without atrophy or fasciculations.  Motor: Tone is normal. Bulk is normal.  She has good strength in her left upper extremity, she has weakness of her right upper extremity, more prominent distally, though she does not drift.  She is unable to lift her right leg against gravity, son notes this has been present since her hip surgery.  Left leg with great effort she is able to hold with minimal drift, but required repeated encouragement to do so. Sensory: Sensation is symmetric to light touch and temperature in the arms and legs. Cerebellar: No ataxia on finger-nose-finger   I have reviewed labs in epic and the results pertinent to this consultation are:   I have reviewed the images obtained:CT from yesterday - negative.   Impression: 88 yo F with chronic right  sided weakness that has not yet been worked up.  My suspicion is she likely had a stroke 2 months ago based on the distribution of her weakness.  I do think an MRI would be reasonable, but would treat with secondary stroke prevention measures in any case.  No need for dual antiplatelet therapy as she is outside the window for this need.  Recommendations: - HgbA1c, fasting lipid panel - MRI of the brain without contrast - Frequent neuro checks - Echocardiogram - MRA head, carotid Dopplers - Prophylactic therapy-Antiplatelet med: Aspirin - dose 81mg   - Risk  factor modification - Telemetry monitoring - PT consult, OT consult - Stroke team to follow    Ritta Slot, MD [facility] [phone]  If 7pm- 7am, please page neurology on call as listed in AMION.

## 2023-03-23 NOTE — Progress Notes (Signed)
PT Cancellation Note  Patient Details Name: Joy Sanchez MRN: [id number] DOB: 1934-12-30   Cancelled Treatment:    Reason Eval/Treat Not Completed: Medical issues which prohibited therapy Code stroke called. OT saw patient earlier and noted unilateral weakness and coordination deficits which were new findings. Will hold until imaging complete.   Joy Sanchez, PT, DPT Physical Therapist - [place]  [facility]    Joy Sanchez 03/23/2023, 10:04 AM

## 2023-03-23 NOTE — Evaluation (Signed)
Occupational Therapy Evaluation Patient Details Name: Joy Sanchez MRN: [id number] DOB: 23-Sep-1934 Today's Date: 03/23/2023   History of Present Illness Pt is an 88 y/o female admitted secondary to generalized weakness. Pt with recurrent UTIs (failed outpatient management) and presenting with dehydration. Further work-up still pending (PNA vs atelectesis). PMH including but not limited to HTN, HLD, IIDM, recurrent UTIs, osteoporosis.   Clinical Impression   Pt was seen for OT evaluation this date. Prior to hospital admission, pt reports ambulating with AD and requiring some assist for bathing and dressing from her son who she lives with. Pt presents to acute OT demonstrating impaired ADL performance and functional mobility 2/2 decreased strength (R weaker than L), RUE decr FMC, impaired balance, and activity tolerance (See OT problem list for additional functional deficits). Pt currently requires MAX A to MAX A +2 for bed mobility, CGA improving to Supv for seated EOB fair balance to complete grooming tasks. Pt noted to prefer L hand for initiating grooming tasks despite being R handed. MMT and coordination testing reveals unilateral weakness on R side and impaired RUE [facility] that son reports is new. Pt notes this is about 2 months old, but son reiterates that he's never seen this before now. Pt also tends to keep RUE in more flexed positioning unless cued to relax. Pt required MIN A for donning shoes EOB and MAX A for STS and MIN A for lateral scoots EOB. MD, RN, PT notified of unilateral deficits noted during session. Pt would benefit from skilled OT services to address noted impairments and functional limitations (see below for any additional details) in order to maximize safety and independence while minimizing falls risk and caregiver burden.     If plan is discharge home, recommend the following: A lot of help with walking and/or transfers;A lot of help with bathing/dressing/bathroom;Assistance  with cooking/housework;Assist for transportation;Help with stairs or ramp for entrance;Direct supervision/assist for medications management    Functional Status Assessment  Patient has had a recent decline in their functional status and demonstrates the ability to make significant improvements in function in a reasonable and predictable amount of time.  Equipment Recommendations  BSC/3in1    Recommendations for Other Services       Precautions / Restrictions Precautions Precautions: Fall Restrictions Weight Bearing Restrictions: No      Mobility Bed Mobility Overal bed mobility: Needs Assistance Bed Mobility: Supine to Sit, Sit to Supine     Supine to sit: Max assist Sit to supine: Max assist, +2 for safety/equipment        Transfers Overall transfer level: Needs assistance Equipment used: 1 person hand held assist Transfers: Sit to/from Stand, Bed to chair/wheelchair/BSC Sit to Stand: Max assist          Lateral/Scoot Transfers: Min assist General transfer comment: effortful, successful on 2nd attempt EOB      Balance Overall balance assessment: Needs assistance Sitting-balance support: Feet supported Sitting balance-[name]: Fair     Standing balance support: Single extremity supported Standing balance-[name]: Poor                             ADL either performed or assessed with clinical judgement   ADL Overall ADL's : Needs assistance/impaired     Grooming: Sitting;Set up;Supervision/safety;Applying deodorant;Brushing hair;Minimal assistance Grooming Details (indicate cue type and reason): applying lotion, required MIN A for LUE 2/2 RUE weakness and decreased FMC, pt demonstrating preference for using LUE despite  being R handed.             Lower Body Dressing: Sitting/lateral leans;Minimal assistance Lower Body Dressing Details (indicate cue type and reason): MIN A to initiate donning of shoes and pt able to complete donning  to slip her heel into shoes while seated EOB without UE assist required                     Vision         Perception         Praxis         Pertinent Vitals/Pain Pain Assessment Pain Assessment: Faces Faces Pain Scale: Hurts a little bit Pain Location: IV site Pain Descriptors / Indicators: Burning Pain Intervention(s): Monitored during session, Repositioned, Patient requesting pain meds-RN notified     Extremity/Trunk Assessment Upper Extremity Assessment Upper Extremity Assessment: Generalized weakness;Right hand dominant;RUE deficits/detail RUE Deficits / Details: decreased FMC with finger to nose testing, tends to keep RUE in flexed position, especially hand until cued to use, weaker than LUE RUE Coordination: decreased fine motor;decreased gross motor   Lower Extremity Assessment Lower Extremity Assessment: Generalized weakness;RLE deficits/detail RLE Deficits / Details: pt/son note hx of R hip injury which limits her strength; pt denies sensory changes       Communication Communication Communication: Difficulty communicating thoughts/reduced clarity of speech;Difficulty following commands/understanding   Cognition Arousal: Alert Behavior During Therapy: Flat affect Overall Cognitive Status: No family/caregiver present to determine baseline cognitive functioning                                 General Comments: Slow processing, tends to parrot worsds spoken by OT at various points during session, cues + time for following commands     General Comments       Exercises Other Exercises Other Exercises: Pt engaged in STS transfers and lateral scoots   Shoulder Instructions      Home Living Family/patient expects to be discharged to:: Private residence Living Arrangements: Children Available Help at Discharge: Family;Available PRN/intermittently Type of Home: House Home Access: Ramped entrance     Home Layout: One level      Bathroom Shower/Tub: Sponge bathes at baseline         Home Equipment: Agricultural consultant (2 wheels);Rollator (4 wheels);Tub bench;Lift chair          Prior Functioning/Environment Prior Level of Function : Needs assist             Mobility Comments: pt ambulates with use of RW, sleeps in her lift chair ADLs Comments: requires assistance from son for dressing and bathing        OT Problem List: Decreased strength;Decreased coordination;Decreased cognition;Decreased range of motion;Decreased activity tolerance;Impaired balance (sitting and/or standing);Decreased knowledge of use of DME or AE;Impaired UE functional use      OT Treatment/Interventions: Self-care/ADL training;Therapeutic exercise;Therapeutic activities;Neuromuscular education;Cognitive remediation/compensation;DME and/or AE instruction;Patient/family education;Energy conservation;Balance training    OT Goals(Current goals can be found in the care plan section) Acute Rehab OT Goals Patient Stated Goal: get better OT Goal Formulation: With patient/family Time For Goal Achievement: 04/06/23 Potential to Achieve Goals: Good ADL Goals Pt Will Perform Grooming: sitting;with modified independence Pt Will Transfer to Toilet: ambulating;bedside commode;with mod assist (LRAD) Pt Will Perform Toileting - Clothing Manipulation and hygiene: with modified independence Additional ADL Goal #1: Pt will use dominant hand to complete ADL tasks with modified independence, 3/3  opportunities.  OT Frequency: Min 1X/week    Co-evaluation              AM-PAC OT "6 Clicks" Daily Activity     Outcome Measure Help from another person eating meals?: A Little Help from another person taking care of personal grooming?: A Little Help from another person toileting, which includes using toliet, bedpan, or urinal?: A Lot Help from another person bathing (including washing, rinsing, drying)?: A Lot Help from another person to put on and  taking off regular upper body clothing?: A Lot Help from another person to put on and taking off regular lower body clothing?: A Lot 6 Click Score: 14   End of Session Nurse Communication: Mobility status;Other (comment) (Notified MD, RN, PT about unilateral deficits noted immediately following session)  Activity Tolerance: Patient tolerated treatment well Patient left: in bed;with call bell/phone within reach;with bed alarm set;with family/visitor present  OT Visit Diagnosis: Other abnormalities of gait and mobility (R26.89);Hemiplegia and hemiparesis;Muscle weakness (generalized) (M62.81) Hemiplegia - Right/Left: Right Hemiplegia - dominant/non-dominant: Dominant Hemiplegia - caused by: Unspecified                Time: 8657-8469 OT Time Calculation (min): 29 min Charges:  OT General Charges $OT Visit: 1 Visit OT Evaluation $OT Eval Moderate Complexity: 1 Mod OT Treatments $Self Care/Home Management : 8-22 mins  Arman Filter., MPH, MS, OTR/L ascom [phone] 03/23/23, 2:32 PM

## 2023-03-23 NOTE — TOC Progression Note (Signed)
Transition of Care [facility]) - Progression Note    Patient Details  Name: Joy Sanchez MRN: [id number] Date of Birth: Apr 15, 1935  [facility] CM/SW Contact  Garret Reddish, RN Phone Number: 03/23/2023, 10:31 AM  Clinical Narrative:   Chart reviewed.  Noted that patient was admitted with generalized weakness.  Code Stroke was called today.  Patient will receive Stroke workup.  Neurology has been consulted.  TOC will follow for discharge planning.           Expected Discharge Plan and Services                                               Social Determinants of Health (SDOH) Interventions SDOH Screenings   Food Insecurity: Patient Declined (03/22/2023)  Housing: Patient Declined (03/22/2023)  Transportation Needs: Patient Declined (03/22/2023)  Utilities: Patient Declined (03/22/2023)  Tobacco Use: Low Risk  (03/21/2023)    Readmission Risk Interventions     No data to display

## 2023-03-23 NOTE — Progress Notes (Signed)
[facility] Progress Note  Patient: Joy Sanchez    RUE:[id number]  DOA: 03/22/2023     Date of Service: the patient was seen and examined on 03/23/2023  Chief Complaint  Patient presents with   Weakness   Brief hospital course: Ashanna Personette is a 88 y.o. female with medical history significant of HTN, HLD, IIDM, recurrent UTIs, osteoporosis, presented with UTI symptoms and generalized weakness.   Patient has had multiple UTIs since May this year and treated with antibiotics.  Last week, patient developed again UTI symptoms including urinary frequency and burning sensation and again was diagnosed with UTI and urine culture showed Citrobacter and patient has been treated for last 3 days of Macrobid 2 times a day with no significant improvement.  Since yesterday patient has been feeling generalized weakness, she stayed in the recliner whole day.  Denies any fever chills no cough no chest pains or abdominal pains.   ED Course: Afebrile, no tachycardia no hypotension nonhypoxic.  UA showed WBC 11-20, many bacteria, chest x-ray suspicious for atelectasis versus pneumonia.  WBC 7.9, hemoglobin 15, creatinine 0.9.   Patient was given ceftriaxone and azithromycin  Assessment and Plan:  Recurrent UTI, failed outpatient management -Review of most recent 2 UTIs, Citrobacter in August and Klebsiella/E. coli pansensitive in June and July, will use doxycycline to cover both UTI and possible pneumonia. -Discussed with family regarding future suppressive antibiotic treatment Follow urine culture  Question of pneumonia vs atelectasis Continue Incentive telemetry CXR: Mild diffuse bilateral interstitial opacity, consistent with edema or atypical/viral infection. No focal airspace opacity. Elevation of the right hemidiaphragm with bandlike scarring of the right lung base. Continue antibiotics as above Follow with PCP to repeat chest x-ray after 4 weeks    Right-sided weakness for past  2 months, gradually getting worse.  Patient presented with Generalized weakness and Impaired ambulation Continue aspirin 81 mg p.o. daily and Lipitor 10 mg p.o. daily Continue to monitor on telemetry Continue fall precautions Continue neurocheck every 4 hourly LDL 31, TSH 2.5 WNL, check A1c TTE shows LVEF 60-65%, no WMA, grade 1 diastolic dysfunction, RV enlarged, mild reduced systolic function. Follow MRI and MRI brain Follow-up carotid sonogram PT OT evaluation Neurology consulted, follow for further recommendation   Dehydration -Clinically appears to be dry and blood work showed hemoconcentration -Likely secondary to UTI -s/p IV fluid, continue oral hydration   HTN -Controlled, continue amlodipine   Vitamin B12 383, goal >400, started oral supplement.   Body mass index is 31.46 kg/m.  Interventions:  Diet: Heart healthy diet DVT Prophylaxis: Subcutaneous Lovenox   Advance goals of care discussion: DNR  Family Communication: family was not present at bedside, at the time of interview.  The pt provided permission to discuss medical plan with the family. Opportunity was given to ask question and all questions were answered satisfactorily.   Disposition:  Pt is from Home, admitted with recurrent UTI, right-sided weakness, CVA workup pending, which precludes a safe discharge. Discharge to SNF TBD after PT and OT eval, when clinically stable. TOC consulted for possible placement  Subjective: No significant events overnight, patient mentioned that she is having right-sided weakness for past 2 months, code stroke was called initially and it was consulted.  Patient denies any new complaints, no chest pain or palpitation, no shortness of breath.  Physical Exam: General: NAD, lying comfortably Appear in no distress, affect appropriate Eyes: PERRLA ENT: Oral Mucosa Clear, moist  Neck: no JVD,  Cardiovascular: S1 and S2  Present, no Murmur,  Respiratory: good respiratory  effort, Bilateral Air entry equal and Decreased, no Crackles, no wheezes Abdomen: Bowel Sound present, Soft and no tenderness,  Skin: no rashes Extremities: no Pedal edema, no calf tenderness Neurologic: Right-sided weakness power 4/5, RLE weakness > RUE weakness.  No any other focal findings Gait not checked due to patient safety concerns  Vitals:   03/22/23 1638 03/22/23 1937 03/23/23 0428 03/23/23 0844  BP: (!) 119/47 (!) 119/50 (!) 103/48 (!) 121/49  Pulse: 72 67 60 (!) 58  Resp: 17 18 18 14   Temp: 98.4 F (36.9 C) 98.6 F (37 C) 98.1 F (36.7 C) 98.1 F (36.7 C)  TempSrc: Oral  Oral   SpO2: 95% 96% 94% 95%  Height:        Intake/Output Summary (Last 24 hours) at 03/23/2023 1602 Last data filed at 03/23/2023 9432 Gross per 24 hour  Intake 800 ml  Output --  Net 800 ml   There were no vitals filed for this visit.  Data Reviewed: I have personally reviewed and interpreted daily labs, tele strips, imagings as discussed above. I reviewed all nursing notes, pharmacy notes, vitals, pertinent old records I have discussed plan of care as described above with RN and patient/family.  CBC: Recent Labs  Lab 03/21/23 2330 03/23/23 0839  WBC 7.9 6.0  HGB 15.2* 13.1  HCT 48.1* 39.0  MCV 103.0* 95.6  PLT 173 150   Basic Metabolic Panel: Recent Labs  Lab 03/21/23 2330 03/23/23 0839  NA 135 139  K 4.2 4.3  CL 101 107  CO2 21* 25  GLUCOSE 135* 93  BUN 21 19  CREATININE 0.97 0.80  CALCIUM 9.3 8.7*  MG  --  1.8  PHOS  --  2.6    Studies: US Carotid Bilateral  Result Date: 03/23/2023 CLINICAL DATA:  Stroke EXAM: BILATERAL CAROTID DUPLEX ULTRASOUND TECHNIQUE: Wallace Cullens scale imaging, color Doppler and duplex ultrasound were performed of bilateral carotid and vertebral arteries in the neck. COMPARISON:  None Available. FINDINGS: Criteria: Quantification of carotid stenosis is based on velocity parameters that correlate the residual internal carotid diameter with NASCET-based  stenosis levels, using the diameter of the distal internal carotid lumen as the denominator for stenosis measurement. The following velocity measurements were obtained: RIGHT ICA: 76/16 cm/sec CCA: 89/11 cm/sec SYSTOLIC ICA/CCA RATIO:  0.9 ECA: 127 cm/sec LEFT ICA: 58/11 cm/sec CCA: 69/9 cm/sec SYSTOLIC ICA/CCA RATIO:  0.8 ECA: 59 cm/sec RIGHT CAROTID ARTERY: No significant atherosclerotic plaque. No stenosis by Doppler criteria. Normal low resistance waveforms of the internal carotid artery. RIGHT VERTEBRAL ARTERY:  Antegrade flow LEFT CAROTID ARTERY: No significant atherosclerotic plaque. No stenosis by Doppler criteria. Normal low resistance waveforms of the internal carotid artery. LEFT VERTEBRAL ARTERY:  Antegrade flow IMPRESSION: 1. Normal examination of the carotid arteries. No significant atherosclerotic plaque in either carotid circulation. No stenosis by Doppler criteria. 2. Bilateral vertebral arteries demonstrate normal antegrade flow. Electronically Signed   By: Olive Bass M.D.   On: 03/23/2023 14:26   DG Chest 1 View  Result Date: 03/23/2023 CLINICAL DATA:  Pneumonia EXAM: CHEST  1 VIEW COMPARISON:  03/21/2023 FINDINGS: The heart size and mediastinal contours are within normal limits. Mild diffuse bilateral interstitial opacity. Elevation of the right hemidiaphragm with bandlike scarring of the right lung base. The visualized skeletal structures are unremarkable. IMPRESSION: 1. Mild diffuse bilateral interstitial opacity, consistent with edema or atypical/viral infection. No focal airspace opacity. 2. Elevation of the right hemidiaphragm with bandlike  scarring of the right lung base. Electronically Signed   By: Jearld Lesch M.D.   On: 03/23/2023 10:58    Scheduled Meds:  aspirin EC  81 mg Oral Daily   atorvastatin  10 mg Oral Daily   enoxaparin (LOVENOX) injection  40 mg Subcutaneous Q24H   ezetimibe  10 mg Oral Daily   Continuous Infusions:  doxycycline (VIBRAMYCIN) IV 100 mg  (03/23/23 0826)   PRN Meds: guaiFENesin-dextromethorphan, LORazepam, ondansetron **OR** ondansetron (ZOFRAN) IV  Time spent: 35 minutes  Author: Gillis Santa. MD [facility] 03/23/2023 4:02 PM  To reach On-call, see care teams to locate the attending and reach out to them via [email or web address]. If 7PM-7AM, please contact night-coverage If you still have difficulty reaching the attending provider, please page the [facility] (Director on Call) for [facility] on amion for assistance.

## 2023-03-24 DIAGNOSIS — R531 Weakness: Secondary | ICD-10-CM | POA: Diagnosis not present

## 2023-03-24 MED ORDER — DOXYCYCLINE HYCLATE 100 MG PO TABS
100.0000 mg | ORAL_TABLET | Freq: Two times a day (BID) | ORAL | Status: DC
  Administered 2023-03-24 – 2023-03-26 (×5): 100 mg via ORAL
  Filled 2023-03-24 (×5): qty 1

## 2023-03-24 NOTE — Evaluation (Signed)
Clinical/Bedside Swallow Evaluation Patient Details  Name: Joy Sanchez MRN: [id number] Date of Birth: [birth year]  Today's Date: 03/24/2023 Time: SLP Start Time (ACUTE ONLY): 1130 SLP Stop Time (ACUTE ONLY): 1225 SLP Time Calculation (min) (ACUTE ONLY): 55 min  Past Medical History:  Past Medical History:  Diagnosis Date   Diabetes mellitus without complication (HCC)    GERD (gastroesophageal reflux disease)    Hypercholesteremia    Hypertension    Osteoporosis    Past Surgical History:  Past Surgical History:  Procedure Laterality Date   ABDOMINAL HYSTERECTOMY     APPENDECTOMY     KNEE SURGERY Bilateral    HPI:  Pt is an 88 y/o female admitted secondary to generalized weakness.  Pt with recurrent UTIs (failed outpatient management) - pt has had several UTI over the last few months who is presenting to the ED with dehydration.  Pt has been on multiple antibiotics per Son, chart.  Code stroke called on 8/12, MRI negative: "No acute finding by MRI. Atrophy and chronic small-vessel ischemic changes"..  Pt takes LORazepam for Anxiety nightly.   PMH including but not limited to HTN, Anxiety, HLD, IIDM, recurrent UTIs, GERD, osteoporosis.  CXR: Mild diffuse bilateral interstitial opacity, consistent with  edema or atypical/viral infection. No focal airspace opacity.  Sons take care of pt at home.    Assessment / Plan / Recommendation  Clinical Impression   Pt seen for BSE today. Pt awake, siting in chair in room. Son present. Pt endorsed feeling "tired". She was encouraged to remain sitting in chair w/ Lunch meal, then return to bed. Pt agreed.  Received an order for BSE today; pt had a code stroke called yesterday though MRI was negative (see Results). Pt initially did Not endorse any swallowing problems w/ meals but then endorsed "sometimes holding the liquid in my mouth b/f swallowing"; she did Not describe overt, clinical s/s of aspiration such as coughing/choking w/ po intake. She  did state she was "reclined in my chair at home" during meals and drinking liquids -- discussed this could increased risk for coughing/aspiration to occur.  CXR revealed "no focal airspace opacity".  Pt has had FREQUENT UTIs in recent months and has been on multiple antibiotics per Son/chart notes. She endorsed feelings of fatigue in recent weeks/currently. Sons support all of her ADLs in the home per report. Pt's speech was 100% intelligible; slightly decreased in effort. Vocal quality c/b mild+ gravely quality and monotone. Son stated "none of her family is very excitable".  Pt on RA, afebrile. WBC wnl.   Pt appears to present w/ grossly adequate oropharyngeal phase swallow w/ No overt oropharyngeal phase dysphagia noted, No overt neuromuscular deficits noted. Pt consumed po trials w/ No immediate, overt clinical s/s of aspiration during po trials. Pt appears at reduced risk for aspiration following general aspiration precautions.   However, pt does have challenging factors that could impact her oropharyngeal swallowing to include GERD - ANY Dysmotility or Regurgitation of Reflux material can increase risk for aspiration of the Reflux material during Retrograde flow thus impact Voicing and Pulmonary status(pt described infrequent issues of globus, throat clearing/cough -- could not ID any certain time-frame nor liquids/foods that recently caused swallowing difficulty), deconditioning/weakness, support w/ all ADLs in the home, and advanced age. These factors can increase risk for aspiration, dysphagia as well as decreased oral intake overall.   During po trials, pt consumed all consistencies w/ no overt coughing, decline in vocal quality, or change in respiratory presentation  during/post trials. O2 sats remained 97-98% when checked. Oral phase appeared grossly [facility] w/ timely bolus management, mastication, and control of bolus propulsion for A-P transfer for swallowing. Slight hesitation of A-P transfer w/  final swallow of multiple swallowing noted x1. This was not repeated during this session. Oral clearing achieved w/ all trial consistencies -- moistened, soft foods given. OM Exam appeared [facility] w/ no unilateral weakness noted. Speech Clear. Pt fed self w/ setup support.  Recommend continue a fairly Regular consistency diet w/ well-Cut meats, moistened foods - easy to eat foods for conservation of energy and overall intake; Thin liquids -- monitor straw use/large straw use w/ bigger drink cups for too much liquid in the mouth -- a smaller Cup and straw could be helpful. Pt should Hold Cup when drinking. Recommend general aspiration precautions, REFLUX precautions. Reduce distractions/talking at meals. Pills WHOLE in Puree for safer, easier swallowing as needed for now and at D/C.  Education given on Pills in Puree for safer swallowing; food consistencies and easy to eat options; general aspiration and REFLUX precautions; supportive drink cups/straws to pt and Son. BOth agreed.  No further skilled ST services indicated for swallowing at this time. MD/NSG to reconsult if any new needs arise during admit. MD updated. Recommend Dietician f/u for support if needed; Palliative Care if needed for support. Discussed w/ Son that if changes in pt's vocal quality/speech are noted different from her Baseline upon return home(given rest post Acute illness) during her typical ADLs, then her PCP could recommend ST services in the home for speech therapy. Briefly discussed the importance to manage REFLUX as any retrograde flow could impact vocal quality. Son and pt agreed.  SLP Visit Diagnosis: Dysphagia, unspecified (R13.10)    Aspiration Risk   (reduced following general aspiration precautions)    Diet Recommendation   Thin;Age appropriate regular (well-cut meats and foods; moistened foods = easy to eat foods) = continue a fairly Regular consistency diet w/ well-Cut meats, moistened foods - easy to eat foods for  conservation of energy and overall intake; Thin liquids -- monitor straw use/large straw use w/ bigger drink cups for too much liquid in the mouth -- a smaller Cup and straw could be helpful. Pt should Hold Cup when drinking. Recommend general aspiration precautions, REFLUX precautions. Reduce distractions/talking at meals.   Medication Administration: Whole meds with puree    Other  Recommendations Recommended Consults:  (Dietician and Palliative Care f/u for GOC) Oral Care Recommendations: Oral care BID;Oral care before and after PO;Patient independent with oral care (setup)    Recommendations for follow up therapy are one component of a multi-disciplinary discharge planning process, led by the attending physician.  Recommendations may be updated based on patient status, additional functional criteria and insurance authorization.  Follow up Recommendations No SLP follow up (for swallowing)      Assistance Recommended at Discharge  Intermittent+  Functional Status Assessment Patient has had a recent decline in their functional status and demonstrates the ability to make significant improvements in function in a reasonable and predictable amount of time.  Frequency and Duration  (n/a)   (n/a)       Prognosis Prognosis for improved oropharyngeal function: Fair (-Good) Barriers to Reach Goals:  (GERD; generalized weakness)      Swallow Study   General Date of Onset: 03/22/23 HPI: Pt is an 88 y/o female admitted secondary to generalized weakness.  Pt with recurrent UTIs (failed outpatient management) - pt has had several UTI over  the last few months who is presenting to the ED with dehydration.  Pt has been on multiple antibiotics per Son, chart.  Code stroke called on 8/12, MRI negative: "No acute finding by MRI. Atrophy and chronic small-vessel ischemic changes"..  Pt takes LORazepam for Anxiety nightly.   PMH including but not limited to HTN, Anxiety, HLD, IIDM, recurrent UTIs, GERD,  osteoporosis.  CXR: Mild diffuse bilateral interstitial opacity, consistent with  edema or atypical/viral infection. No focal airspace opacity.  Sons take care of pt at home. Type of Study: Bedside Swallow Evaluation Previous Swallow Assessment: none Diet Prior to this Study: Regular;Thin liquids (Level 0) Temperature Spikes Noted: No (wbc 4.8) Respiratory Status: Room air History of Recent Intubation: No Behavior/Cognition: Alert;Cooperative;Pleasant mood (slow, montone speech -- Son did not indicate much change from her Baseline) Oral Cavity Assessment: Within Functional Limits Oral Care Completed by SLP: Yes Oral Cavity - Dentition: Adequate natural dentition Vision: Functional for self-feeding Self-Feeding Abilities: Able to feed self;Needs assist;Needs set up Patient Positioning: Upright in chair (supported moe midline/upright) Baseline Vocal Quality:  (monotone) Volitional Cough: Strong Volitional Swallow: Able to elicit    Oral/Motor/Sensory Function Overall Oral Motor/Sensory Function: Within functional limits   Ice Chips Ice chips: Within functional limits Presentation: Spoon (fed; 2 trials)   Thin Liquid Thin Liquid: Within functional limits Presentation: Cup;Self Fed (~5-6 ozs total) Other Comments: water, juice    Nectar Thick Nectar Thick Liquid: Not tested   Honey Thick Honey Thick Liquid: Not tested   Puree Puree: Within functional limits Presentation: Self Fed;Spoon (8 trials)   Solid     Solid: Within functional limits Presentation: Self Fed (4 trials) Other Comments: moistened        Jerilynn Som, MS, CCC-SLP Speech Language Pathologist Rehab Services; [facility] [place] [phone] (ascom) Xochilth Standish 03/24/2023,4:07 PM

## 2023-03-24 NOTE — Plan of Care (Signed)

## 2023-03-24 NOTE — Progress Notes (Signed)
Physical Therapy Treatment Patient Details Name: Joy Sanchez MRN: [id number] DOB: March 22, 1935 Today's Date: 03/24/2023   History of Present Illness Pt is an 88 y/o female admitted secondary to generalized weakness. Pt with recurrent UTIs (failed outpatient management) and presenting with dehydration. Further work-up still pending (PNA vs atelectesis). Code stroke called on 8/12, MRI negative. PMH including but not limited to HTN, HLD, IIDM, recurrent UTIs, osteoporosis.    PT Comments  Patient seen in conjunction with OT this date to maximize patient's activity tolerance. Code stroke called previous date with noted R sided weakness and facial droop, however MRI negative. Patient demonstrates R UE/LE weakness and R inattention with mobility. Patient requires modA+2 for bed mobility and minA+2 for sit to stand. Able to take pivotal steps towards recliner with minA+2 and RW. Encouraged use of R UE with functional tasks while in recliner as patient tends to utilize L UE for all tasks due to RUE weakness and coordination deficits. Son present at end of session and educated him on encouraging use of R side during meals and other activities. Discharge plan remains appropriate.      If plan is discharge home, recommend the following: A lot of help with walking and/or transfers;A lot of help with bathing/dressing/bathroom;Assistance with cooking/housework;Assistance with feeding;Assist for transportation;Help with stairs or ramp for entrance;Supervision due to cognitive status   Can travel by private vehicle     No  Equipment Recommendations  None recommended by PT    Recommendations for Other Services       Precautions / Restrictions Precautions Precautions: Fall Precaution Comments: mild R inattention Restrictions Weight Bearing Restrictions: No     Mobility  Bed Mobility Overal bed mobility: Needs Assistance Bed Mobility: Supine to Sit     Supine to sit: Mod assist, +2 for  physical assistance     General bed mobility comments: increased time and effort to complete. Assist for B LE management and hand placement on bed rails to assist with trunk elevation.    Transfers Overall transfer level: Needs assistance Equipment used: Rolling [name] (2 wheels) Transfers: Sit to/from Stand, Bed to chair/wheelchair/BSC Sit to Stand: Min assist, +2 physical assistance, +2 safety/equipment   Step pivot transfers: Min assist, +2 physical assistance, +2 safety/equipment       General transfer comment: able to stand from EOB with minA+2 this date with cues for hand placement. Able to take steps towards recliner with assist for balance and RW management during transfer.    Ambulation/Gait                   Stairs             Wheelchair Mobility     Tilt Bed    Modified Rankin (Stroke Patients Only)       Balance Overall balance assessment: Needs assistance Sitting-balance support: Feet supported Sitting balance-[name]: Fair     Standing balance support: Bilateral upper extremity supported, Reliant on assistive device for balance Standing balance-[name]: Poor                              Cognition Arousal: Alert Behavior During Therapy: Flat affect Overall Cognitive Status: No family/caregiver present to determine baseline cognitive functioning Area of Impairment: Attention, Memory, Following commands, Safety/judgement, Awareness, Problem solving                   Current Attention Level: Sustained Memory: Decreased  short-term memory Following Commands: Follows one step commands inconsistently, Follows one step commands with increased time Safety/Judgement: Decreased awareness of deficits, Decreased awareness of safety Awareness: Intellectual Problem Solving: Slow processing, Decreased initiation, Difficulty sequencing, Requires verbal cues, Requires tactile cues          Exercises      General  Comments        Pertinent Vitals/Pain Pain Assessment Pain Assessment: No/denies pain    Home Sanchez                          Prior Function            PT Goals (current goals can now be found in the care plan section) Acute Rehab PT Goals PT Goal Formulation: With patient/family Time For Goal Achievement: 04/05/23 Potential to Achieve Goals: Good Progress towards PT goals: Progressing toward goals    Frequency    Min 1X/week      PT Plan      Co-evaluation              AM-PAC PT "6 Clicks" Mobility   Outcome Measure  Help needed turning from your back to your side while in a flat bed without using bedrails?: A Lot Help needed moving from lying on your back to sitting on the side of a flat bed without using bedrails?: A Lot Help needed moving to and from a bed to a chair (including a wheelchair)?: A Lot Help needed standing up from a chair using your arms (e.g., wheelchair or bedside chair)?: A Lot Help needed to walk in hospital room?: A Lot Help needed climbing 3-5 steps with a railing? : Total 6 Click Score: 11    End of Session   Activity Tolerance: Patient tolerated treatment well Patient left: in chair;with call bell/phone within reach;with chair alarm set Nurse Communication: Mobility status PT Visit Diagnosis: Other abnormalities of gait and mobility (R26.89)     Time: 1610-9604 PT Time Calculation (min) (ACUTE ONLY): 29 min  Charges:    $Therapeutic Activity: 8-22 mins PT General Charges $$ ACUTE PT VISIT: 1 Visit                     Maylon Peppers, PT, DPT Physical Therapist - [facility]  [facility]    [name] 03/24/2023, 11:11 AM

## 2023-03-24 NOTE — Progress Notes (Signed)
MRI negative for acute changes, she has extensive chronic ischemic disease that could be masking an old stroke. Based on history and facial involvement, still feel that her right sided weakness is due to stroke several months ago based on her history.   Echo and Carotid US negative Telemetry with sinus rhythm.  LDL 38, ok A1c pending, recommend goal < 7. Continue asa 81mg  daily.   Neurology will be available as needed. Please call with further questions or concerns.   Ritta Slot, MD [facility] [phone]  If 7pm- 7am, please page neurology on call as listed in AMION.

## 2023-03-24 NOTE — Progress Notes (Signed)
[facility] Progress Note  Patient: Joy Sanchez    ONG:[id number]  DOA: 03/22/2023     Date of Service: the patient was seen and examined on 03/24/2023  Chief Complaint  Patient presents with   Weakness   Brief hospital course: Joy Sanchez is a 88 y.o. female with medical history significant of HTN, HLD, IIDM, recurrent UTIs, osteoporosis, presented with UTI symptoms and generalized weakness.   Patient has had multiple UTIs since May this year and treated with antibiotics.  Last week, patient developed again UTI symptoms including urinary frequency and burning sensation and again was diagnosed with UTI and urine culture showed Citrobacter and patient has been treated for last 3 days of Macrobid 2 times a day with no significant improvement.  Since yesterday patient has been feeling generalized weakness, she stayed in the recliner whole day.  Denies any fever chills no cough no chest pains or abdominal pains.   ED Course: Afebrile, no tachycardia no hypotension nonhypoxic.  UA showed WBC 11-20, many bacteria, chest x-ray suspicious for atelectasis versus pneumonia.  WBC 7.9, hemoglobin 15, creatinine 0.9.   Patient was given ceftriaxone and azithromycin  Assessment and Plan:  Recurrent UTI, failed outpatient management -Review of most recent 2 UTIs, Citrobacter in August and Klebsiella/E. coli pansensitive in June and July, will use doxycycline to cover both UTI and possible pneumonia. -Discussed with family regarding future suppressive antibiotic treatment Follow urine culture NGTD  Question of pneumonia vs atelectasis Continue Incentive telemetry CXR: Mild diffuse bilateral interstitial opacity, consistent with edema or atypical/viral infection. No focal airspace opacity. Elevation of the right hemidiaphragm with bandlike scarring of the right lung base. Continue antibiotics as above Follow with PCP to repeat chest x-ray after 4 weeks    Right-sided weakness for  past 2 months, gradually getting worse.  Patient presented with Generalized weakness and Impaired ambulation Continue aspirin 81 mg p.o. daily and Lipitor 10 mg p.o. daily Continue to monitor on telemetry Continue fall precautions Continue neurocheck every 4 hourly LDL 31, TSH 2.5 WNL, check A1c TTE shows LVEF 60-65%, no WMA, grade 1 diastolic dysfunction, RV enlarged, mild reduced systolic function. MRI and MRI brain negative for any acute findings Carotid duplex negative for significant stenosis PT/OT evaluation recommended SNF placement Neurology consulted, workup negative for acute stroke but patient was having extensive chronic ischemic disease and right-sided weakness could be due to stroke several months ago.  Dehydration -Clinically appeared dry and blood work showed hemoconcentration, Likely secondary to UTI -s/p IV fluid, continue oral hydration   HTN -Controlled, d/c'd amlodipine Monitor BP without medications for now  Vitamin B12 383, goal >400, started oral supplement.   Body mass index is 31.46 kg/m.  Interventions:  Diet: Heart healthy diet DVT Prophylaxis: Subcutaneous Lovenox   Advance goals of care discussion: DNR  Family Communication: family was not present at bedside, at the time of interview.  The pt provided permission to discuss medical plan with the family. Opportunity was given to ask question and all questions were answered satisfactorily.   Disposition:  Pt is from Home, admitted with recurrent UTI, right-sided weakness, CVA workup pending, which precludes a safe discharge. Discharge to SNF, when bed will be available.   Follow TOC for placement   Subjective: No significant events overnight, patient has weakness on the right side is stable, no new neurological symptoms.  Denied any active issues.   Physical Exam: General: NAD, lying comfortably Appear in no distress, affect appropriate Eyes: PERRLA ENT:  Oral Mucosa Clear, moist  Neck: no  JVD,  Cardiovascular: S1 and S2 Present, no Murmur,  Respiratory: good respiratory effort, Bilateral Air entry equal and Decreased, no Crackles, no wheezes Abdomen: Bowel Sound present, Soft and no tenderness,  Skin: no rashes Extremities: no Pedal edema, no calf tenderness Neurologic: Right-sided weakness power 4/5, RLE weakness > RUE weakness.  No any other focal findings Gait not checked due to patient safety concerns  Vitals:   03/24/23 0432 03/24/23 0813 03/24/23 1231 03/24/23 1611  BP: (!) 107/49 (!) 116/54 (!) 154/60 (!) 125/58  Pulse: (!) 104 (!) 56 82 69  Resp: 16 18 17 16   Temp: 97.8 F (36.6 C) 98.4 F (36.9 C) 98.2 F (36.8 C)   TempSrc: Oral  Oral   SpO2: 94% 92% 99% 99%  Height:        Intake/Output Summary (Last 24 hours) at 03/24/2023 1641 Last data filed at 03/24/2023 1515 Gross per 24 hour  Intake 200 ml  Output 1700 ml  Net -1500 ml   There were no vitals filed for this visit.  Data Reviewed: I have personally reviewed and interpreted daily labs, tele strips, imagings as discussed above. I reviewed all nursing notes, pharmacy notes, vitals, pertinent old records I have discussed plan of care as described above with RN and patient/family.  CBC: Recent Labs  Lab 03/21/23 2330 03/23/23 0839 03/24/23 0325  WBC 7.9 6.0 4.8  HGB 15.2* 13.1 12.3  HCT 48.1* 39.0 36.2  MCV 103.0* 95.6 96.3  PLT 173 150 141*   Basic Metabolic Panel: Recent Labs  Lab 03/21/23 2330 03/23/23 0839 03/24/23 0325  NA 135 139 136  K 4.2 4.3 3.8  CL 101 107 105  CO2 21* 25 26  GLUCOSE 135* 93 90  BUN 21 19 23   CREATININE 0.97 0.80 0.84  CALCIUM 9.3 8.7* 8.2*  MG  --  1.8 2.0  PHOS  --  2.6 2.9    Studies: No results found.  Scheduled Meds:  aspirin EC  81 mg Oral Daily   atorvastatin  10 mg Oral Daily   vitamin B-12  500 mcg Oral Daily   doxycycline  100 mg Oral Q12H   enoxaparin (LOVENOX) injection  40 mg Subcutaneous Q24H   ezetimibe  10 mg Oral Daily    Continuous Infusions:   PRN Meds: guaiFENesin-dextromethorphan, LORazepam, ondansetron **OR** ondansetron (ZOFRAN) IV, polyvinyl alcohol  Time spent: 35 minutes  Author: Gillis Santa. MD [facility] 03/24/2023 4:41 PM  To reach On-call, see care teams to locate the attending and reach out to them via [email or web address]. If 7PM-7AM, please contact night-coverage If you still have difficulty reaching the attending provider, please page the [facility] (Director on Call) for [facility] on amion for assistance.

## 2023-03-24 NOTE — TOC Progression Note (Signed)
Transition of Care [facility]) - Progression Note    Patient Details  Name: Joy Sanchez MRN: [id number] Date of Birth: 05/31/1935  Transition of [facility] CM/SW Contact  Hetty Ely, RN Phone Number: 03/24/2023, 9:24 AM  Clinical Narrative: CM spoke with patient's Son via phone, he's receptive to SNF, preference [facility], patient was there before, however wants to also review other local facilities. CM explained the process that Rehab referral will be sent electronically to local facilities and he will be notified of available bed offers. Son voices understanding.         Expected Discharge Plan and Services                                               Social Determinants of Health (SDOH) Interventions SDOH Screenings   Food Insecurity: Patient Declined (03/22/2023)  Housing: Patient Declined (03/22/2023)  Transportation Needs: Patient Declined (03/22/2023)  Utilities: Patient Declined (03/22/2023)  Tobacco Use: Low Risk  (03/21/2023)    Readmission Risk Interventions     No data to display

## 2023-03-24 NOTE — Progress Notes (Signed)
Occupational Therapy Treatment Patient Details Name: Joy Sanchez MRN: [id number] DOB: 25-Dec-1934 Today's Date: 03/24/2023   History of present illness Pt is an 88 y/o female admitted secondary to generalized weakness. Pt with recurrent UTIs (failed outpatient management) and presenting with dehydration. Further work-up still pending (PNA vs atelectesis). Code stroke called on 8/12, MRI negative. PMH including but not limited to HTN, HLD, IIDM, recurrent UTIs, osteoporosis.   OT comments  Pt seen for OT tx this date with cotx with PT to maximize ADL/mobility. Pt requires modA+2 for bed mobility and minA+2 for sit to stand. Able to take pivotal steps towards recliner with minA+2 and RW. Pt engaged in seated ADL tasks, requiring VC and then restricting LUE use for pt to utilize her RUE for grooming tasks requiring MIN A for overhead reaching. [facility] and pt frequently attempting to utilize LUE for task and using R for supporting bilat tasks. Pt/family educated in strategies to support improved attention to R side, rolled washcloth for R hand to prevent composite finger flexion and maintain more neutral positioning, and strategies to facilitate improved RUE involvement in ADL and how family member can best assist. Pt/son verbalized understanding. Continues to benefit from skilled OT services and discharge recommendations remain appropriate.       If plan is discharge home, recommend the following:  A lot of help with walking and/or transfers;A lot of help with bathing/dressing/bathroom;Assistance with cooking/housework;Assist for transportation;Help with stairs or ramp for entrance;Direct supervision/assist for medications management   Equipment Recommendations  BSC/3in1    Recommendations for Other Services      Precautions / Restrictions Precautions Precautions: Fall Precaution Comments: mild R inattention Restrictions Weight Bearing Restrictions: No       Mobility Bed  Mobility Overal bed mobility: Needs Assistance Bed Mobility: Supine to Sit     Supine to sit: Mod assist, +2 for physical assistance     General bed mobility comments: increased time and effort to complete. Assist for B LE management and hand placement on bed rails to assist with trunk elevation.    Transfers Overall transfer level: Needs assistance Equipment used: Rolling walker (2 wheels) Transfers: Sit to/from Stand, Bed to chair/wheelchair/BSC Sit to Stand: Min assist, +2 physical assistance, +2 safety/equipment     Step pivot transfers: Min assist, +2 physical assistance, +2 safety/equipment     General transfer comment: able to stand from EOB with minA+2 this date with cues for hand placement. Able to take steps towards recliner with assist for balance and RW management during transfer.     Balance Overall balance assessment: Needs assistance Sitting-balance support: Feet supported Sitting balance-[name]: Fair     Standing balance support: Bilateral upper extremity supported, Reliant on assistive device for balance Standing balance-[name]: Poor                             ADL either performed or assessed with clinical judgement   ADL Overall ADL's : Needs assistance/impaired     Grooming: Sitting;Set up;Supervision/safety;Brushing hair;Minimal assistance;Oral care Grooming Details (indicate cue type and reason): Pt required VC and then restricting LUE use for pt to utilize her RUE for grooming tasks requiring MIN A for overhead reaching. [facility] and pt frequently attempting to utilize LUE for task and using R for supporting bilat tasks.  Extremity/Trunk Assessment              Vision Patient Visual Report: No change from baseline Additional Comments: Appears pt may have some mild inattention to R side   Perception     Praxis      Cognition Arousal: Alert Behavior During Therapy:  Flat affect Overall Cognitive Status: Impaired/Different from baseline Area of Impairment: Attention, Memory, Following commands, Safety/judgement, Awareness, Problem solving                   Current Attention Level: Sustained Memory: Decreased short-term memory Following Commands: Follows one step commands inconsistently, Follows one step commands with increased time Safety/Judgement: Decreased awareness of deficits, Decreased awareness of safety Awareness: Intellectual Problem Solving: Slow processing, Decreased initiation, Difficulty sequencing, Requires verbal cues, Requires tactile cues General Comments: slow processing, slightly slurred speech (like yesterday's session), pt endorses word finding difficulties        Exercises Other Exercises Other Exercises: Pt/family educated in strategies to support improved attention to R side, rolled washcloth for R hand to prevent composite finger flexion and maintain more neutral positioning, and strategies to facilitate improved RUE involvement in ADL and how family member can best assist.    Shoulder Instructions       General Comments      Pertinent Vitals/ Pain       Pain Assessment Pain Assessment: No/denies pain  Home Living                                          Prior Functioning/Environment              Frequency  Min 1X/week        Progress Toward Goals  OT Goals(current goals can now be found in the care plan section)  Progress towards OT goals: Progressing toward goals  Acute Rehab OT Goals Patient Stated Goal: get better OT Goal Formulation: With patient/family Time For Goal Achievement: 04/06/23 Potential to Achieve Goals: Good  Plan      Co-evaluation                 AM-PAC OT "6 Clicks" Daily Activity     Outcome Measure   Help from another person eating meals?: A Little Help from another person taking care of personal grooming?: A Little Help from another  person toileting, which includes using toliet, bedpan, or urinal?: A Lot Help from another person bathing (including washing, rinsing, drying)?: A Lot Help from another person to put on and taking off regular upper body clothing?: A Lot Help from another person to put on and taking off regular lower body clothing?: A Lot 6 Click Score: 14    End of Session    OT Visit Diagnosis: Other abnormalities of gait and mobility (R26.89);Hemiplegia and hemiparesis;Muscle weakness (generalized) (M62.81) Hemiplegia - Right/Left: Right Hemiplegia - dominant/non-dominant: Dominant Hemiplegia - caused by: Unspecified   Activity Tolerance Patient tolerated treatment well   Patient Left in chair;with call bell/phone within reach;with chair alarm set;with family/visitor present   Nurse Communication Mobility status;Other (comment) (message sent to care team re: SLP consult and another to nursing re: assist for bath today)        Time: 4782-9562 OT Time Calculation (min): 29 min  Charges: OT General Charges $OT Visit: 1 Visit OT Treatments $Self Care/Home Management : 8-22 mins  Arman Filter., MPH, MS,  OTR/L ascom [phone] 03/24/23, 1:19 PM

## 2023-03-24 NOTE — NC FL2 (Signed)
[place] MEDICAID FL2 LEVEL OF CARE FORM     IDENTIFICATION  Patient Name: Joy Sanchez Birthdate: 1935/06/29 Sex: female Admission Date (Current Location): 03/22/2023  [facility] and [place] Number:  Chiropodist and Address:  [facility], [address], [place], [place] [address]      Provider Number: [id number]  Attending Physician Name and Address:  Gillis Santa, MD  Relative Name and Phone Number:  Ezmae, Limbert)  [phone]    Current Level of Care: Hospital Recommended Level of Care: Skilled Nursing Facility Prior Approval Number:    Date Approved/Denied:   PASRR Number: [id number]  Discharge Plan: SNF    Current Diagnoses: Patient Active Problem List   Diagnosis Date Noted   Generalized weakness 03/22/2023   UTI (urinary tract infection) 03/22/2023    Orientation RESPIRATION BLADDER Height & Weight     Self, Situation  Normal External catheter Weight:   Height:  5\' 2"  (157.5 cm)  BEHAVIORAL SYMPTOMS/MOOD NEUROLOGICAL BOWEL NUTRITION STATUS      Continent Diet  AMBULATORY STATUS COMMUNICATION OF NEEDS Skin   Extensive Assist Verbally Normal                       Personal Care Assistance Level of Assistance  Bathing, Dressing Bathing Assistance: Limited assistance   Dressing Assistance: Limited assistance     Functional Limitations Info  Sight, Hearing, Speech Sight Info: Adequate Hearing Info: Adequate Speech Info: Adequate    SPECIAL CARE FACTORS FREQUENCY  PT (By licensed PT), OT (By licensed OT)     PT Frequency: 5X WEEK OT Frequency: 5X WEEK            Contractures Contractures Info: Present    Additional Factors Info  Allergies   Allergies Info: Bisphosphonates Not Specified  Nausea Only   Codeine           Current Medications (03/24/2023):  This is the current hospital active medication list Current Facility-Administered Medications  Medication Dose Route Frequency  Provider Last Rate Last Admin   aspirin EC tablet 81 mg  81 mg Oral Daily Mikey College T, MD   81 mg at 03/24/23 0848   atorvastatin (LIPITOR) tablet 10 mg  10 mg Oral Daily Mikey College T, MD   10 mg at 03/24/23 0007   cyanocobalamin (VITAMIN B12) tablet 500 mcg  500 mcg Oral Daily Gillis Santa, MD   500 mcg at 03/24/23 0848   doxycycline (VIBRA-TABS) tablet 100 mg  100 mg Oral Q12H Sharen Hones, RPH   100 mg at 03/24/23 0848   enoxaparin (LOVENOX) injection 40 mg  40 mg Subcutaneous Q24H Mikey College T, MD   40 mg at 03/24/23 0847   ezetimibe (ZETIA) tablet 10 mg  10 mg Oral Daily Mikey College T, MD   10 mg at 03/24/23 0007   guaiFENesin-dextromethorphan (ROBITUSSIN DM) 100-10 MG/5ML syrup 5 mL  5 mL Oral Q4H PRN Mikey College T, MD   5 mL at 03/22/23 2256   LORazepam (ATIVAN) tablet 0.5 mg  0.5 mg Oral QHS PRN Emeline General, MD       ondansetron [facility]) tablet 4 mg  4 mg Oral Q6H PRN Emeline General, MD       Or   ondansetron [facility]) injection 4 mg  4 mg Intravenous Q6H PRN Mikey College T, MD       polyvinyl alcohol (LIQUIFILM TEARS) 1.4 % ophthalmic solution 1 drop  1  drop Both Eyes PRN Gillis Santa, MD   1 drop at 03/24/23 0007     Discharge Medications: Please see discharge summary for a list of discharge medications.  Relevant Imaging Results:  Relevant Lab Results:   Additional Information SS# [id number]  Hetty Ely, RN

## 2023-03-25 DIAGNOSIS — R531 Weakness: Secondary | ICD-10-CM | POA: Diagnosis not present

## 2023-03-25 MED ORDER — POLYETHYLENE GLYCOL 3350 17 G PO PACK
17.0000 g | PACK | Freq: Two times a day (BID) | ORAL | Status: DC
  Administered 2023-03-25 – 2023-03-26 (×3): 17 g via ORAL
  Filled 2023-03-25 (×3): qty 1

## 2023-03-25 MED ORDER — BACLOFEN 10 MG PO TABS
5.0000 mg | ORAL_TABLET | Freq: Three times a day (TID) | ORAL | Status: DC
  Administered 2023-03-25 – 2023-03-26 (×3): 5 mg via ORAL
  Filled 2023-03-25 (×3): qty 1

## 2023-03-25 MED ORDER — BISACODYL 5 MG PO TBEC
10.0000 mg | DELAYED_RELEASE_TABLET | Freq: Once | ORAL | Status: AC
  Administered 2023-03-25: 10 mg via ORAL
  Filled 2023-03-25: qty 2

## 2023-03-25 MED ORDER — BISACODYL 5 MG PO TBEC: 5.0000 mg | DELAYED_RELEASE_TABLET | Freq: Every day | ORAL | Status: DC | PRN

## 2023-03-25 MED ORDER — BISACODYL 10 MG RE SUPP: 10.0000 mg | Freq: Every day | RECTAL | Status: DC | PRN

## 2023-03-25 NOTE — Plan of Care (Signed)

## 2023-03-25 NOTE — Progress Notes (Signed)
[facility] Progress Note  Patient: Joy Sanchez    ZOX:[id number]  DOA: 03/22/2023     Date of Service: the patient was seen and examined on 03/25/2023  Chief Complaint  Patient presents with   Weakness   Brief hospital course: Joy Sanchez is a 88 y.o. female with medical history significant of HTN, HLD, IIDM, recurrent UTIs, osteoporosis, presented with UTI symptoms and generalized weakness.   Patient has had multiple UTIs since May this year and treated with antibiotics.  Last week, patient developed again UTI symptoms including urinary frequency and burning sensation and again was diagnosed with UTI and urine culture showed Citrobacter and patient has been treated for last 3 days of Macrobid 2 times a day with no significant improvement.  Since yesterday patient has been feeling generalized weakness, she stayed in the recliner whole day.  Denies any fever chills no cough no chest pains or abdominal pains.   ED Course: Afebrile, no tachycardia no hypotension nonhypoxic.  UA showed WBC 11-20, many bacteria, chest x-ray suspicious for atelectasis versus pneumonia.  WBC 7.9, hemoglobin 15, creatinine 0.9.   Patient was given ceftriaxone and azithromycin  Assessment and Plan:  Recurrent UTI, failed outpatient management -Review of most recent 2 UTIs, Citrobacter in August and Klebsiella/E. coli pansensitive in June and July, will use doxycycline to cover both UTI and possible pneumonia. -Discussed with family regarding future suppressive antibiotic treatment urine culture NGTD  Question of pneumonia vs atelectasis Continue Incentive telemetry CXR: Mild diffuse bilateral interstitial opacity, consistent with edema or atypical/viral infection. No focal airspace opacity. Elevation of the right hemidiaphragm with bandlike scarring of the right lung base. Continue antibiotics as above Follow with PCP to repeat chest x-ray after 4 weeks    Right-sided weakness for past 2  months, gradually getting worse.  Patient presented with Generalized weakness and Impaired ambulation Continue aspirin 81 mg p.o. daily and Lipitor 10 mg p.o. daily Continue to monitor on telemetry Continue fall precautions Continue neurocheck every 4 hourly LDL 31, TSH 2.5 WNL, check A1c TTE shows LVEF 60-65%, no WMA, grade 1 diastolic dysfunction, RV enlarged, mild reduced systolic function. MRI and MRI brain negative for any acute findings Carotid duplex negative for significant stenosis PT/OT evaluation recommended SNF placement Neurology consulted, workup negative for acute stroke but patient was having extensive chronic ischemic disease and right-sided weakness could be due to stroke several months ago.  Dehydration -Clinically appeared dry and blood work showed hemoconcentration, Likely secondary to UTI -s/p IV fluid, continue oral hydration   HTN -Controlled, d/c'd amlodipine Monitor BP without medications for now  Vitamin B12 383, goal >400, started oral supplement.   Body mass index is 31.46 kg/m.  Interventions:  Diet: Heart healthy diet DVT Prophylaxis: Subcutaneous Lovenox   Advance goals of care discussion: DNR  Family Communication: family was not present at bedside, at the time of interview.  The pt provided permission to discuss medical plan with the family. Opportunity was given to ask question and all questions were answered satisfactorily.   Disposition:  Pt is from Home, admitted with recurrent UTI, right-sided weakness, CVA workup pending, which precludes a safe discharge. Discharge to SNF, when bed will be available.   Follow TOC for placement   Subjective: No significant events overnight, patient has weakness on the right side which is stable, no new neurological symptoms.  Denied any active issues.   Physical Exam: General: NAD, lying comfortably Appear in no distress, affect appropriate Eyes: PERRLA ENT:  Oral Mucosa Clear, moist  Neck: no  JVD,  Cardiovascular: S1 and S2 Present, no Murmur,  Respiratory: good respiratory effort, Bilateral Air entry equal and Decreased, no Crackles, no wheezes Abdomen: Bowel Sound present, Soft and no tenderness,  Skin: no rashes Extremities: no Pedal edema, no calf tenderness Neurologic: Right-sided weakness power 4/5, RLE weakness > RUE weakness.  No any other focal findings.  Rigidity on right side Gait not checked due to patient safety concerns  Vitals:   03/24/23 1939 03/25/23 0043 03/25/23 0421 03/25/23 0913  BP: (!) 134/48 (!) 108/51 (!) 125/53 124/71  Pulse: 71 73 68 77  Resp: 18 18 16 16   Temp: 98.3 F (36.8 C) 98.1 F (36.7 C) 99.7 F (37.6 C) 98.3 F (36.8 C)  TempSrc:      SpO2: 95% 96% 94% 95%  Height:        Intake/Output Summary (Last 24 hours) at 03/25/2023 1246 Last data filed at 03/25/2023 0915 Gross per 24 hour  Intake 300 ml  Output 1600 ml  Net -1300 ml   There were no vitals filed for this visit.  Data Reviewed: I have personally reviewed and interpreted daily labs, tele strips, imagings as discussed above. I reviewed all nursing notes, pharmacy notes, vitals, pertinent old records I have discussed plan of care as described above with RN and patient/family.  CBC: Recent Labs  Lab 03/21/23 2330 03/23/23 0839 03/24/23 0325 03/25/23 0332  WBC 7.9 6.0 4.8 6.4  HGB 15.2* 13.1 12.3 12.8  HCT 48.1* 39.0 36.2 37.7  MCV 103.0* 95.6 96.3 95.2  PLT 173 150 141* 159   Basic Metabolic Panel: Recent Labs  Lab 03/21/23 2330 03/23/23 0839 03/24/23 0325 03/25/23 0332  NA 135 139 136 137  K 4.2 4.3 3.8 3.9  CL 101 107 105 106  CO2 21* 25 26 24   GLUCOSE 135* 93 90 105*  BUN 21 19 23  24*  CREATININE 0.97 0.80 0.84 0.73  CALCIUM 9.3 8.7* 8.2* 8.5*  MG  --  1.8 2.0 2.1  PHOS  --  2.6 2.9 2.9    Studies: No results found.  Scheduled Meds:  aspirin EC  81 mg Oral Daily   atorvastatin  10 mg Oral Daily   vitamin B-12  500 mcg Oral Daily    doxycycline  100 mg Oral Q12H   enoxaparin (LOVENOX) injection  40 mg Subcutaneous Q24H   ezetimibe  10 mg Oral Daily   Continuous Infusions:   PRN Meds: guaiFENesin-dextromethorphan, LORazepam, ondansetron **OR** ondansetron (ZOFRAN) IV, polyvinyl alcohol  Time spent: 35 minutes  Author: Gillis Santa. MD [facility] 03/25/2023 12:46 PM  To reach On-call, see care teams to locate the attending and reach out to them via [email or web address]. If 7PM-7AM, please contact night-coverage If you still have difficulty reaching the attending provider, please page the [facility] (Director on Call) for [facility] on amion for assistance.

## 2023-03-25 NOTE — Progress Notes (Signed)
Per Dr Dwyane Dee, dc tele monitoring

## 2023-03-25 NOTE — TOC Progression Note (Addendum)
Transition of Care [facility]) - Progression Note    Patient Details  Name: Joy Sanchez MRN: [id number] Date of Birth: June 12, 1935  [facility]) CM/SW Contact  Margarito Liner, LCSW Phone Number: 03/25/2023, 12:47 PM  Clinical Narrative:  Patient confirmed she is agreeable to SNF. CSW reviewed bed offers. She does not have a preference and agreed to have CSW call her son for choice. CSW emailed him list of bed offers. He will review with his brother and follow up with decision. CSW encouraged him to have decision by this afternoon or tomorrow morning.   1:43 pm: Son accepted bed offer from [facility]. Per MD, patient is medically stable. [facility] will have a bed available tomorrow. Son is aware and will update patient.  Expected Discharge Plan and Services                                               Social Determinants of Health (SDOH) Interventions SDOH Screenings   Food Insecurity: Patient Declined (03/22/2023)  Housing: Patient Declined (03/22/2023)  Transportation Needs: Patient Declined (03/22/2023)  Utilities: Patient Declined (03/22/2023)  Tobacco Use: Low Risk  (03/21/2023)    Readmission Risk Interventions     No data to display

## 2023-03-25 NOTE — Progress Notes (Addendum)
Occupational Therapy Treatment Patient Details Name: Joy Sanchez MRN: [id number] DOB: 07-27-1935 Today's Date: 03/25/2023   History of present illness Pt is an 88 y/o female admitted secondary to generalized weakness. Pt with recurrent UTIs (failed outpatient management) and presenting with dehydration. Further work-up still pending (PNA vs atelectesis). Code stroke called on 8/12, MRI negative. PMH including but not limited to HTN, HLD, IIDM, recurrent UTIs, osteoporosis.   OT comments  Chart reviewed, pt greeted in bed, agreeable to OT tx session targeting improving functional activity tolerance in the setting of ADLs. Pt requires increased time for processing with ?awareness throughout. Improvements noted in mobility initially requiring +2 for bed>bsc however pt able to transition to +1 for step pivot from bsc to bed with a distant +2 for safety. Multi modal cueing required for safe transfers. TOTAL a required for toileting with pt requiring education on breathing techniques and body mechanics for pelvic floor relaxation, HR up to 130s. Nurse notified. Pt will continue to benefit from skilled OT to address functional deficits. OT will follow acutely.       If plan is discharge home, recommend the following:  A lot of help with walking and/or transfers;A lot of help with bathing/dressing/bathroom;Assistance with cooking/housework;Assist for transportation;Help with stairs or ramp for entrance;Direct supervision/assist for medications management   Equipment Recommendations  BSC/3in1    Recommendations for Other Services      Precautions / Restrictions Precautions Precautions: Fall Restrictions Weight Bearing Restrictions: No       Mobility Bed Mobility Overal bed mobility: Needs Assistance Bed Mobility: Supine to Sit     Supine to sit: Mod assist, HOB elevated, Used rails          Transfers Overall transfer level: Needs assistance Equipment used: Rolling walker (2  wheels) Transfers: Sit to/from Stand Sit to Stand: Min assist, Mod assist, +2 physical assistance, +2 safety/equipment     Step pivot transfers: Mod assist     General transfer comment: step by step multi modal cueing     Balance Overall balance assessment: Needs assistance Sitting-balance support: Feet supported Sitting balance-[name]: Fair   Postural control: Posterior lean Standing balance support: Bilateral upper extremity supported, Reliant on assistive device for balance Standing balance-[name]: Poor                             ADL either performed or assessed with clinical judgement   ADL Overall ADL's : Needs assistance/impaired Eating/Feeding: Supervision/ safety Eating/Feeding Details (indicate cue type and reason): vcs for aspiration precautions                     Toilet Transfer: +2 for safety/equipment;Minimal assistance;Moderate assistance;BSC/3in1   Toileting- Clothing Manipulation and Hygiene: Total assistance;Sit to/from stand Toileting - Clothing Manipulation Details (indicate cue type and reason): BM on bsc              Cognition Arousal: Alert Behavior During Therapy: Flat affect Overall Cognitive Status: No family/caregiver present to determine baseline cognitive functioning Area of Impairment: Attention, Memory, Following commands, Safety/judgement, Awareness, Problem solving                   Current Attention Level: Sustained Memory: Decreased short-term memory Following Commands: Follows one step commands with increased time Safety/Judgement: Decreased awareness of deficits, Decreased awareness of safety Awareness: Intellectual Problem Solving: Slow processing, Decreased initiation, Difficulty sequencing, Requires verbal cues, Requires tactile cues  General Comments HR up to 130s while attempting to have BM, pt educated on PLB, body mechanics to facilitate pelvic floor release     Pertinent Vitals/ Pain       Pain Assessment Pain Assessment: No/denies pain   Frequency  Min 1X/week        Progress Toward Goals  OT Goals(current goals can now be found in the care plan section)  Progress towards OT goals: Progressing toward goals     Plan      Co-evaluation    PT/OT/SLP Co-Evaluation/Treatment: Yes Reason for Co-Treatment: For patient/therapist safety;To address functional/ADL transfers PT goals addressed during session: Mobility/safety with mobility;Proper use of DME OT goals addressed during session: ADL's and self-care      AM-PAC OT "6 Clicks" Daily Activity     Outcome Measure   Help from another person eating meals?: A Little Help from another person taking care of personal grooming?: A Little Help from another person toileting, which includes using toliet, bedpan, or urinal?: Total Help from another person bathing (including washing, rinsing, drying)?: A Lot Help from another person to put on and taking off regular upper body clothing?: A Lot Help from another person to put on and taking off regular lower body clothing?: A Lot 6 Click Score: 13    End of Session Equipment Utilized During Treatment: Gait belt;Rolling walker (2 wheels)  OT Visit Diagnosis: Other abnormalities of gait and mobility (R26.89);Hemiplegia and hemiparesis;Muscle weakness (generalized) (M62.81) Hemiplegia - Right/Left: Right Hemiplegia - dominant/non-dominant: Dominant   Activity Tolerance Patient tolerated treatment well   Patient Left Other (comment) (in care of PT on bsc)   Nurse Communication Mobility status        Time: 5784-6962 OT Time Calculation (min): 27 min  Charges: OT General Charges $OT Visit: 1 Visit OT Treatments $Self Care/Home Management : 8-22 mins  Oleta Mouse, OTD OTR/L  03/25/23, 4:30 PM

## 2023-03-25 NOTE — Care Management Important Message (Signed)
Important Message  Patient Details  Name: Joy Sanchez MRN: [id number] Date of Birth: 07/21/1935   Medicare Important Message Given:  Yes     Olegario Messier A Dontay Harm 03/25/2023, 2:14 PM

## 2023-03-25 NOTE — Progress Notes (Addendum)
Physical Therapy Treatment Patient Details Name: Joy Sanchez MRN: [id number] DOB: 17-Jan-1935 Today's Date: 03/25/2023   History of Present Illness Pt is an 88 y/o female admitted secondary to generalized weakness. Pt with recurrent UTIs (failed outpatient management) and presenting with dehydration. Further work-up still pending (PNA vs atelectesis). Code stroke called on 8/12, MRI negative. PMH including but not limited to HTN, HLD, IIDM, recurrent UTIs, osteoporosis.    PT Comments  Pt presents sitting EOB with OT, no complaints of pain. Cotreatment with OT performed for patient and therapist safety. Pt able to transfer from bed>BSC with minAx1 and RW for toileting. Pt performed 3 sit<>stand from [facility] due to difficutly with BM and required assistance for toileting, progressing to modA+2 due to fatigue. Pt initiated transfer from BSC>bed with modAx2, but unable to complete, requiring bed to be repositioned behind pt for immediate sit. Pt limited by fatigue during today's session. Pt would benefit from continued skilled care to maximize functional abilities.     If plan is discharge home, recommend the following: A lot of help with bathing/dressing/bathroom;Assistance with cooking/housework;Assistance with feeding;Assist for transportation;Help with stairs or ramp for entrance;Supervision due to cognitive status;Two people to help with walking and/or transfers   Can travel by private vehicle     No  Equipment Recommendations  None recommended by PT    Recommendations for Other Services       Precautions / Restrictions Precautions Precautions: Fall Restrictions Weight Bearing Restrictions: No     Mobility  Bed Mobility Overal bed mobility: Needs Assistance Bed Mobility: Sit to Supine       Sit to supine: Max assist, +2 for safety/equipment        Transfers Overall transfer level: Needs assistance Equipment used: Rolling walker (2 wheels) Transfers: Sit to/from  Stand Sit to Stand: Min assist, Mod assist, +2 physical assistance, +2 safety/equipment   Step pivot transfers: +2 physical assistance, Mod assist            Ambulation/Gait               General Gait Details: unable to walk due to fatigue   Stairs             Wheelchair Mobility     Tilt Bed    Modified Rankin (Stroke Patients Only)       Balance Overall balance assessment: Needs assistance Sitting-balance support: Feet supported Sitting balance-[name]: Fair     Standing balance support: Bilateral upper extremity supported, Reliant on assistive device for balance Standing balance-[name]: Poor                              Cognition Arousal: Alert Behavior During Therapy: Flat affect Overall Cognitive Status: No family/caregiver present to determine baseline cognitive functioning                                          Exercises      General Comments General comments (skin integrity, edema, etc.): HR up to 130s while attempting to have BM, pt educated on PLB, body mechanics to facilitate pelvic floor release      Pertinent Vitals/Pain Pain Assessment Pain Assessment: No/denies pain    Home Living  Prior Function            PT Goals (current goals can now be found in the care plan section) Progress towards PT goals: Progressing toward goals    Frequency    Min 1X/week      PT Plan      Co-evaluation PT/OT/SLP Co-Evaluation/Treatment: Yes Reason for Co-Treatment: For patient/therapist safety;To address functional/ADL transfers PT goals addressed during session: Mobility/safety with mobility;Proper use of DME OT goals addressed during session: ADL's and self-care      AM-PAC PT "6 Clicks" Mobility   Outcome Measure  Help needed turning from your back to your side while in a flat bed without using bedrails?: A Lot Help needed moving from lying on your  back to sitting on the side of a flat bed without using bedrails?: A Lot Help needed moving to and from a bed to a chair (including a wheelchair)?: A Lot Help needed standing up from a chair using your arms (e.g., wheelchair or bedside chair)?: A Lot Help needed to walk in hospital room?: Total Help needed climbing 3-5 steps with a railing? : Total 6 Click Score: 10    End of Session Equipment Utilized During Treatment: Gait belt Activity Tolerance: Patient tolerated treatment well Patient left: in bed;with bed alarm set;with family/visitor present   PT Visit Diagnosis: Other abnormalities of gait and mobility (R26.89)     Time: 9604-5409 PT Time Calculation (min) (ACUTE ONLY): 27 min  Charges:    $Therapeutic Activity: 8-22 mins PT General Charges $$ ACUTE PT VISIT: 1 Visit                     Conna Terada, PT, SPT 4:20 PM,03/25/23

## 2023-03-26 DIAGNOSIS — R531 Weakness: Secondary | ICD-10-CM | POA: Diagnosis not present

## 2023-03-26 LAB — BASIC METABOLIC PANEL
Anion gap: 7 (ref 5–15)
BUN: 21 mg/dL (ref 8–23)
CO2: 24 mmol/L (ref 22–32)
Calcium: 8.5 mg/dL — ABNORMAL LOW (ref 8.9–10.3)
Chloride: 104 mmol/L (ref 98–111)
Creatinine, Ser: 0.73 mg/dL (ref 0.44–1.00)
GFR, Estimated: >60 mL/min (ref >60)
Glucose, Bld: 102 mg/dL — ABNORMAL HIGH (ref 70–99)
Potassium: 3.7 mmol/L (ref 3.5–5.1)
Sodium: 135 mmol/L (ref 135–145)

## 2023-03-26 LAB — CBC
HCT: 36.4 % (ref 36.0–46.0)
Hemoglobin: 12.6 g/dL (ref 12.0–15.0)
MCH: 32.1 pg (ref 26.0–34.0)
MCHC: 34.6 g/dL (ref 30.0–36.0)
MCV: 92.9 fL (ref 80.0–100.0)
Platelets: 155 10*3/uL (ref 150–400)
RBC: 3.92 MIL/uL (ref 3.87–5.11)
RDW: 13.4 % (ref 11.5–15.5)
WBC: 5.8 10*3/uL (ref 4.0–10.5)
nRBC: 0 % (ref 0.0–0.2)

## 2023-03-26 LAB — PHOSPHORUS: Phosphorus: 3.3 mg/dL (ref 2.5–4.6)

## 2023-03-26 LAB — MAGNESIUM: Magnesium: 2 mg/dL (ref 1.7–2.4)

## 2023-03-26 MED ORDER — BACLOFEN 5 MG PO TABS: 5.0000 mg | ORAL_TABLET | Freq: Three times a day (TID) | ORAL | Status: AC

## 2023-03-26 MED ORDER — CYANOCOBALAMIN 500 MCG PO TABS: 500.0000 ug | ORAL_TABLET | Freq: Every day | ORAL | Status: AC

## 2023-03-26 MED ORDER — POLYETHYLENE GLYCOL 3350 17 G PO PACK: 17.0000 g | PACK | Freq: Two times a day (BID) | ORAL | 0 refills | Status: AC

## 2023-03-26 MED ORDER — LORAZEPAM 0.5 MG PO TABS: 0.5000 mg | ORAL_TABLET | Freq: Every evening | ORAL | 0 refills | Status: AC | PRN

## 2023-03-26 MED ORDER — BISACODYL 5 MG PO TBEC: 10.0000 mg | DELAYED_RELEASE_TABLET | Freq: Every day | ORAL | 0 refills | Status: AC | PRN

## 2023-03-26 NOTE — Discharge Summary (Signed)
[facility] Discharge Summary   Patient: Joy Sanchez ZOX:[id number]  PCP: Gracelyn Nurse, MD  Date of admission: 03/22/2023   Date of discharge:  03/26/2023     Discharge Diagnoses:  Principal Problem:   Generalized weakness Active Problems:   UTI (urinary tract infection)   Admitted From: Home Disposition:  SNF  Recommendations for Outpatient Follow-up:  F/u PCP, need to be seen by an MD in 1-2 days  F/u with Neurology in 1 -2 weeks for Neurodegeneration Eval and w/up Continue to F/u with speech and swallow Follow up LABS/TEST:     Contact information for after-discharge care     Destination     [facility] Preferred SNF .   Service: Skilled Nursing Contact information: [address] [place] [address] [phone]                    Diet recommendation: Regular diet  Activity: The patient is advised to gradually reintroduce usual activities, as tolerated  Discharge Condition: stable  Code Status: DNR   History of present illness: As per the H and P dictated on admission Hospital Course:  Joy Sanchez is a 88 y.o. female with medical history significant of HTN, HLD, IIDM, recurrent UTIs, osteoporosis, presented with UTI symptoms and generalized weakness. Patient has had multiple UTIs since May this year and treated with antibiotics.  Last week, patient developed again UTI symptoms including urinary frequency and burning sensation and again was diagnosed with UTI and urine culture showed Citrobacter and patient has been treated for last 3 days of Macrobid 2 times a day with no significant improvement.  Since yesterday patient has been feeling generalized weakness, she stayed in the recliner whole day.  Denies any fever chills no cough no chest pains or abdominal pains. ED Course: Afebrile, no tachycardia no hypotension nonhypoxic.  UA showed WBC 11-20, many bacteria, chest x-ray suspicious for  atelectasis versus pneumonia.  WBC 7.9, hemoglobin 15, creatinine 0.9. Patient was given ceftriaxone and azithromycin   Assessment and Plan: # Recurrent UTI, failed outpatient management. Reviewed of most recent 2 UTIs, Citrobacter in August and Klebsiella/E. coli pansensitive in June and July, s/p doxycycline to cover both UTI and possible pneumonia. urine culture NGTD.  Recheck UA and urine culture if patient becomes symptomatic.  No need of more antibiotics at this time. # Question of pneumonia vs atelectasis. S/p incentive spirometry CXR: Mild diffuse bilateral interstitial opacity, consistent with edema or atypical/viral infection. No focal airspace opacity. Elevation of the right hemidiaphragm with bandlike scarring of the right lung base. S/p antibiotics as above. Follow with PCP to repeat chest x-ray after 4 weeks # Right-sided weakness for past 2 months, gradually getting worse.  Patient presented with Generalized weakness and Impaired ambulation. Continue aspirin 81 mg p.o. daily and Lipitor 10 mg p.o. daily. Continue fall precautions. LDL 31, TSH 2.5 WNL, Hb A1c 6.1 TTE shows LVEF 60-65%, no WMA, grade 1 diastolic dysfunction, RV enlarged, mild reduced systolic function.  MRI and MRI brain negative for any acute findings. Carotid duplex negative for significant stenosis PT/OT evaluation recommended SNF placement. Neurology consulted, workup negative for acute stroke but patient was having extensive chronic ischemic disease and right-sided weakness could be due to stroke several months ago.  On 8/14 started baclofen 5 mg p.o. TID.  Follow-up with neurology as an outpatient. # Dehydration, Clinically appeared dry and blood work showed hemoconcentration, Likely due to decreased oral intake. s/p IV  fluid, continue oral hydration # HTN, BP is Controlled, d/c'd amlodipine. Monitor BP without medications for now # Vitamin B12 383, goal >400, started oral supplement.   Body mass index is 36.05  kg/m.  Nutrition Interventions:  - [place] Controlled Substance Reporting System database could not be reviewed. Website was working.  - Ativan  0.5 mg po BID prn home dose was continues and 10 tab script was given  - Patient was instructed, not to drive, operate heavy machinery, perform activities at heights, swimming or participation in water activities or provide baby sitting services while on Pain, Sleep and Anxiety Medications; until her outpatient Physician has advised to do so again.  - Also recommended to not to take more than prescribed Pain, Sleep and Anxiety Medications.  Patient was seen by physical therapy, who recommended Therapy, SNF placement, which was arranged. On the day of the discharge the patient's vitals were stable, and no other acute medical condition were reported by patient. the patient was felt safe to be discharge at [facility].  Consultants: Neurology Procedures: None  Discharge Exam: General: Appear in no distress, no Rash; Oral Mucosa Clear, moist. Cardiovascular: S1 and S2 Present, no Murmur, Respiratory: normal respiratory effort, Bilateral Air entry present and no Crackles, no wheezes Abdomen: Bowel Sound present, Soft and no tenderness, no hernia Extremities: no Pedal edema, no calf tenderness Neurology: alert and oriented to time, place, and person affect appropriate.  Filed Weights   03/25/23 1635  Weight: 89.4 kg   Vitals:   03/26/23 0419 03/26/23 0759  BP: (!) 124/47 (!) 121/47  Pulse: 66 70  Resp: 16 17  Temp: 99.3 F (37.4 C) 99 F (37.2 C)  SpO2: 95% 93%    DISCHARGE MEDICATION: Allergies as of 03/26/2023       Reactions   Bisphosphonates Nausea Only   Codeine Nausea Only        Medication List     STOP taking these medications    amLODipine 5 MG tablet Commonly known as: NORVASC   diazepam 2 MG tablet Commonly known as: Valium   gabapentin 300 MG capsule Commonly known as: NEURONTIN    losartan-hydrochlorothiazide 100-25 MG tablet Commonly known as: HYZAAR       TAKE these medications    aspirin EC 81 MG tablet Take 81 mg by mouth daily.   atorvastatin 10 MG tablet Commonly known as: LIPITOR Take 10 mg by mouth daily.   Baclofen 5 MG Tabs Take 1 tablet (5 mg total) by mouth 3 (three) times daily.   bisacodyl 5 MG EC tablet Commonly known as: DULCOLAX Take 2 tablets (10 mg total) by mouth daily as needed for moderate constipation.   calcium citrate 950 (200 Ca) MG tablet Commonly known as: CALCITRATE - dosed in mg elemental calcium Take 200 mg of elemental calcium by mouth daily.   cyanocobalamin 500 MCG tablet Commonly known as: VITAMIN B12 Take 1 tablet (500 mcg total) by mouth daily. Start taking on: March 27, 2023   ezetimibe 10 MG tablet Commonly known as: ZETIA Take 10 mg by mouth daily.   LORazepam 0.5 MG tablet Commonly known as: ATIVAN Take 1 tablet (0.5 mg total) by mouth at bedtime as needed for up to 10 days.   multivitamin capsule Take 1 capsule by mouth daily.   polyethylene glycol 17 g packet Commonly known as: MIRALAX / GLYCOLAX Take 17 g by mouth 2 (two) times daily.       Allergies  Allergen Reactions  Bisphosphonates Nausea Only   Codeine Nausea Only   Discharge Instructions     Call MD for:  difficulty breathing, headache or visual disturbances   Complete by: As directed    Call MD for:  extreme fatigue   Complete by: As directed    Call MD for:  persistant dizziness or light-headedness   Complete by: As directed    Call MD for:  severe uncontrolled pain   Complete by: As directed    Call MD for:  temperature >100.4   Complete by: As directed    Diet - low sodium heart healthy   Complete by: As directed    Discharge instructions   Complete by: As directed    F/u PCP, need to be seen by an MD in 1-2 days  F/u with Neurology in 1 -2 weeks for Neurodegeneration Eval and w/up Continue to F/u with speech and  swallow   Increase activity slowly   Complete by: As directed    No wound care   Complete by: As directed        The results of significant diagnostics from this hospitalization (including imaging, microbiology, ancillary and laboratory) are listed below for reference.    Significant Diagnostic Studies: MR ANGIO HEAD WO CONTRAST  Result Date: 03/23/2023 CLINICAL DATA:  Transient ischemic attack.  Weakness. EXAM: MRA HEAD WITHOUT CONTRAST TECHNIQUE: Angiographic images of the Circle of Willis were acquired using MRA technique without intravenous contrast. COMPARISON:  MRI same day FINDINGS: Anterior circulation: Both internal carotid arteries are patent through the skull base and siphon regions. The anterior and middle cerebral vessels are patent. No proximal stenosis, aneurysm or vascular malformation. Both posterior cerebral arteries take fetal origin from the anterior circulation. Posterior circulation: Both vertebral arteries are patent through the foramen magnum. The right largely terminates in PICA, with only a tiny contribution to the basilar artery. The left vertebral artery supplies the majority of a diminutive basilar artery, due to the fetal origin of both posterior cerebral arteries. Anatomic variants: None other significant. Other: None. IMPRESSION: 1. No acute finding by MRA. No proximal stenosis, aneurysm or vascular malformation. 2. Fetal origin of both posterior cerebral arteries from the anterior circulation. Electronically Signed   By: Paulina Fusi M.D.   On: 03/23/2023 16:45   MR BRAIN WO CONTRAST  Result Date: 03/23/2023 CLINICAL DATA:  Transient ischemic attack.  Generalized weakness EXAM: MRI HEAD WITHOUT CONTRAST TECHNIQUE: Multiplanar, multiecho pulse sequences of the brain and surrounding structures were obtained without intravenous contrast. COMPARISON:  Head CT yesterday FINDINGS: Brain: Diffusion imaging does not show any acute or subacute infarction. Mild chronic  small-vessel ischemic change affects the pons. There are a few old small vessel cerebellar infarctions. Cerebral hemispheres show generalized atrophy with moderate chronic small-vessel ischemic changes of the white matter. No cortical or large vessel territory stroke. No mass, hemorrhage, hydrocephalus or extra-axial collection. Vascular: Major vessels at the base of the brain show flow. Skull and upper cervical spine: Negative Sinuses/Orbits: Clear/normal Other: None IMPRESSION: No acute finding by MRI. Atrophy and chronic small-vessel ischemic changes as outlined above. Electronically Signed   By: Paulina Fusi M.D.   On: 03/23/2023 16:43   ECHOCARDIOGRAM COMPLETE BUBBLE STUDY  Result Date: 03/23/2023    ECHOCARDIOGRAM REPORT   Patient Name:   BHAVANA MCMANAMY Kapral Date of Exam: 03/23/2023 Medical Rec #:  [id number]          Height:       62.0 in Accession #:  [id number]         Weight:       172.0 lb Date of Birth:  [birth year]         BSA:          1.793 m Patient Age:    87 years           BP:           121/49 mmHg Patient Gender: F                  HR:           58 bpm. Exam Location:  [facility] Procedure: 2D Echo, Cardiac Doppler, Color Doppler and Saline Contrast Bubble            Study Indications:     Stroke 434.91 / I63.9  History:         Patient has no prior history of Echocardiogram examinations.                  Risk Factors:Hypertension and Diabetes.  Sonographer:     Cristela Blue Referring Phys:  [id number] Gillis Santa Diagnosing Phys: Alwyn Pea MD IMPRESSIONS  1. Negative bubble study.  2. Left ventricular ejection fraction, by estimation, is 60 to 65%. The left ventricle has normal function. The left ventricle has no regional wall motion abnormalities. Left ventricular diastolic parameters are consistent with Grade I diastolic dysfunction (impaired relaxation).  3. Right ventricular systolic function is mildly reduced. The right ventricular size is mildly enlarged.  4. The mitral valve is normal in  structure. Trivial mitral valve regurgitation.  5. The aortic valve is normal in structure. Aortic valve regurgitation is not visualized. Aortic valve sclerosis/calcification is present, without any evidence of aortic stenosis. FINDINGS  Left Ventricle: Left ventricular ejection fraction, by estimation, is 60 to 65%. The left ventricle has normal function. The left ventricle has no regional wall motion abnormalities. The left ventricular internal cavity size was normal in size. There is  no left ventricular hypertrophy. Left ventricular diastolic parameters are consistent with Grade I diastolic dysfunction (impaired relaxation). Right Ventricle: The right ventricular size is mildly enlarged. No increase in right ventricular wall thickness. Right ventricular systolic function is mildly reduced. Left Atrium: Left atrial size was normal in size. Right Atrium: Right atrial size was normal in size. Pericardium: There is no evidence of pericardial effusion. Mitral Valve: The mitral valve is normal in structure. Trivial mitral valve regurgitation. MV peak gradient, 5.7 mmHg. The mean mitral valve gradient is 3.0 mmHg. Tricuspid Valve: The tricuspid valve is normal in structure. Tricuspid valve regurgitation is mild. Aortic Valve: The aortic valve is normal in structure. Aortic valve regurgitation is not visualized. Aortic valve sclerosis/calcification is present, without any evidence of aortic stenosis. Aortic valve mean gradient measures 8.0 mmHg. Aortic valve peak  gradient measures 12.6 mmHg. Aortic valve area, by VTI measures 1.87 cm. Pulmonic Valve: The pulmonic valve was normal in structure. Pulmonic valve regurgitation is not visualized. Aorta: The ascending aorta was not well visualized. IAS/Shunts: No atrial level shunt detected by color flow Doppler. Agitated saline contrast was given intravenously to evaluate for intracardiac shunting. Additional Comments: Negative bubble study.  LEFT VENTRICLE PLAX 2D LVIDd:          4.50 cm   Diastology LVIDs:         2.60 cm   LV e' medial:    7.94 cm/s LV PW:  1.00 cm   LV E/e' medial:  13.0 LV IVS:        0.90 cm   LV e' lateral:   8.05 cm/s LVOT diam:     2.00 cm   LV E/e' lateral: 12.8 LV SV:         71 LV SV Index:   40 LVOT Area:     3.14 cm  RIGHT VENTRICLE RV Basal diam:  3.50 cm RV Mid diam:    3.10 cm RV S prime:     12.60 cm/s TAPSE (M-mode): 2.3 cm LEFT ATRIUM           Index        RIGHT ATRIUM           Index LA diam:      4.50 cm 2.51 cm/m   RA Area:     11.40 cm LA Vol (A2C): 39.3 ml 21.92 ml/m  RA Volume:   23.90 ml  13.33 ml/m LA Vol (A4C): 57.1 ml 31.85 ml/m  AORTIC VALVE AV Area (Vmax):    1.93 cm AV Area (Vmean):   1.63 cm AV Area (VTI):     1.87 cm AV Vmax:           177.33 cm/s AV Vmean:          132.000 cm/s AV VTI:            0.382 m AV Peak Grad:      12.6 mmHg AV Mean Grad:      8.0 mmHg LVOT Vmax:         109.00 cm/s LVOT Vmean:        68.400 cm/s LVOT VTI:          0.227 m LVOT/AV VTI ratio: 0.59  AORTA Ao Root diam: 2.80 cm MITRAL VALVE                TRICUSPID VALVE MV Area (PHT): 3.12 cm     TR Peak grad:   16.6 mmHg MV Area VTI:   2.10 cm     TR Vmax:        204.00 cm/s MV Peak grad:  5.7 mmHg MV Mean grad:  3.0 mmHg     SHUNTS MV Vmax:       1.19 m/s     Systemic VTI:  0.23 m MV Vmean:      79.1 cm/s    Systemic Diam: 2.00 cm MV Decel Time: 243 msec MV E velocity: 103.00 cm/s MV A velocity: 125.00 cm/s MV E/A ratio:  0.82 Dwayne D Callwood MD Electronically signed by Alwyn Pea MD Signature Date/Time: 03/23/2023/4:10:16 PM    Final    US Carotid Bilateral  Result Date: 03/23/2023 CLINICAL DATA:  Stroke EXAM: BILATERAL CAROTID DUPLEX ULTRASOUND TECHNIQUE: Wallace Cullens scale imaging, color Doppler and duplex ultrasound were performed of bilateral carotid and vertebral arteries in the neck. COMPARISON:  None Available. FINDINGS: Criteria: Quantification of carotid stenosis is based on velocity parameters that correlate the residual  internal carotid diameter with NASCET-based stenosis levels, using the diameter of the distal internal carotid lumen as the denominator for stenosis measurement. The following velocity measurements were obtained: RIGHT ICA: 76/16 cm/sec CCA: 89/11 cm/sec SYSTOLIC ICA/CCA RATIO:  0.9 ECA: 127 cm/sec LEFT ICA: 58/11 cm/sec CCA: 69/9 cm/sec SYSTOLIC ICA/CCA RATIO:  0.8 ECA: 59 cm/sec RIGHT CAROTID ARTERY: No significant atherosclerotic plaque. No stenosis by Doppler criteria. Normal low resistance waveforms of the internal carotid artery. RIGHT VERTEBRAL  ARTERY:  Antegrade flow LEFT CAROTID ARTERY: No significant atherosclerotic plaque. No stenosis by Doppler criteria. Normal low resistance waveforms of the internal carotid artery. LEFT VERTEBRAL ARTERY:  Antegrade flow IMPRESSION: 1. Normal examination of the carotid arteries. No significant atherosclerotic plaque in either carotid circulation. No stenosis by Doppler criteria. 2. Bilateral vertebral arteries demonstrate normal antegrade flow. Electronically Signed   By: Olive Bass M.D.   On: 03/23/2023 14:26   DG Chest 1 View  Result Date: 03/23/2023 CLINICAL DATA:  Pneumonia EXAM: CHEST  1 VIEW COMPARISON:  03/21/2023 FINDINGS: The heart size and mediastinal contours are within normal limits. Mild diffuse bilateral interstitial opacity. Elevation of the right hemidiaphragm with bandlike scarring of the right lung base. The visualized skeletal structures are unremarkable. IMPRESSION: 1. Mild diffuse bilateral interstitial opacity, consistent with edema or atypical/viral infection. No focal airspace opacity. 2. Elevation of the right hemidiaphragm with bandlike scarring of the right lung base. Electronically Signed   By: Jearld Lesch M.D.   On: 03/23/2023 10:58   CT CHEST WO CONTRAST  Result Date: 03/22/2023 CLINICAL DATA:  88 year old female with generalized weakness, respiratory illness, recurrent UTIs. EXAM: CT CHEST WITHOUT CONTRAST TECHNIQUE:  Multidetector CT imaging of the chest was performed following the standard protocol without IV contrast. RADIATION DOSE REDUCTION: This exam was performed according to the departmental dose-optimization program which includes automated exposure control, adjustment of the mA and/or kV according to patient size and/or use of iterative reconstruction technique. COMPARISON:  Chest radiographs 03/21/2023. Prior chest CT 08/30/2004. FINDINGS: Cardiovascular: Calcified coronary artery, Calcified aortic atherosclerosis. Cardiac size has mildly increased since 2006, mild cardiomegaly. No pericardial effusion. Vascular patency is not evaluated in the absence of IV contrast. Mediastinum/Nodes: Negative. No mediastinal mass or lymphadenopathy. Lungs/Pleura: Lower lung volumes compared to 2006 and atelectatic changes to the major airways. Bronchial wall thickening subsequently difficult to judge, but probably no abnormal airway thickening. Streaky bilateral lower lung opacity more resembles atelectasis than infection. No pleural effusion or consolidation. No evidence of pulmonary edema. No pulmonary mass. Upper Abdomen: Abdominal Calcified aortic atherosclerosis. Small calcified splenic granulomas are chronic. Punctate left nephrolithiasis. Negative visible noncontrast liver, gallbladder, pancreas, adrenal glands. Nondilated bowel and no free air or free fluid in the visible upper abdomen. Musculoskeletal: Chronic appearing mild T7 anterior wedge compression fracture. T8 superior endplate deformity is mild and may be a chronic Schmorl's node. Underlying generalized osteopenia. No acute or suspicious osseous lesion identified. IMPRESSION: 1. Lower lung volumes compared to a prior Chest CT with streaky lower lung opacity more resembling atelectasis than infection. No pleural effusion. 2. Mild cardiomegaly. Calcified coronary artery and Aortic Atherosclerosis (ICD10-I70.0). Left nephrolithiasis. Electronically Signed   By: Odessa Fleming  M.D.   On: 03/22/2023 04:17   CT HEAD WO CONTRAST ( )  Result Date: 03/22/2023 CLINICAL DATA:  88 year old female with weakness, mental status change. Recurrent UTIs. EXAM: CT HEAD WITHOUT CONTRAST TECHNIQUE: Contiguous axial images were obtained from the base of the skull through the vertex without intravenous contrast. RADIATION DOSE REDUCTION: This exam was performed according to the departmental dose-optimization program which includes automated exposure control, adjustment of the mA and/or kV according to patient size and/or use of iterative reconstruction technique. COMPARISON:  None Available. FINDINGS: Brain: Cerebral volume loss appears generalized. No midline shift, ventriculomegaly, mass effect, evidence of mass lesion, intracranial hemorrhage or evidence of cortically based acute infarction. Mild for age scattered cerebral white matter hypodensity. Otherwise preserved gray-white differentiation. No cortical encephalomalacia identified. Vascular: No suspicious  intracranial vascular hyperdensity. Calcified atherosclerosis at the skull base. Skull: No acute osseous abnormality identified. Sinuses/Orbits: Visualized paranasal sinuses and mastoids are clear. Other: No acute orbit or scalp soft tissue finding. IMPRESSION: No acute intracranial abnormality. Largely unremarkable for age noncontrast Head CT. Electronically Signed   By: Odessa Fleming M.D.   On: 03/22/2023 04:11   DG Chest 2 View  Result Date: 03/21/2023 CLINICAL DATA:  Chest pain. EXAM: CHEST - 2 VIEW COMPARISON:  Chest radiograph dated 06/07/2009. FINDINGS: Shallow inspiration with minimal left lung base atelectasis. No focal consolidation, pleural effusion, or pneumothorax. Stable cardiac silhouette. Atherosclerotic calcification of the aorta. Degenerative changes of the spine. No acute osseous pathology. IMPRESSION: Shallow inspiration with minimal left lung base atelectasis. Electronically Signed   By: Elgie Collard M.D.   On:  03/21/2023 23:48    Microbiology: Recent Results (from the past 240 hour(s))  SARS Coronavirus 2 by RT PCR (hospital order, performed in [facility] hospital lab) *cepheid single result test* Urine, Catheterized     Status: None   Collection Time: 03/22/23  4:53 AM   Specimen: Urine, Catheterized; Nasal Swab  Result Value Ref Range Status   SARS Coronavirus 2 by RT PCR NEGATIVE NEGATIVE Final    Comment: (NOTE) SARS-CoV-2 target nucleic acids are NOT DETECTED.  The SARS-CoV-2 RNA is generally detectable in upper and lower respiratory specimens during the acute phase of infection. The lowest concentration of SARS-CoV-2 viral copies this assay can detect is 250 copies / mL. A negative result does not preclude SARS-CoV-2 infection and should not be used as the sole basis for treatment or other patient management decisions.  A negative result may occur with improper specimen collection / handling, submission of specimen other than nasopharyngeal swab, presence of viral mutation(s) within the areas targeted by this assay, and inadequate number of viral copies (<250 copies / mL). A negative result must be combined with clinical observations, patient history, and epidemiological information.  Fact Sheet for Patients:   RoadLapTop.co.za  Fact Sheet for Healthcare Providers: http://kim-miller.com/  This test is not yet approved or  cleared by the [place] FDA and has been authorized for detection and/or diagnosis of SARS-CoV-2 by FDA under an Emergency Use Authorization (EUA).  This EUA will remain in effect (meaning this test can be used) for the duration of the COVID-19 declaration under Section 564(b)(1) of the Act, 21 U.S.C. section 360bbb-3(b)(1), unless the authorization is terminated or revoked sooner.  Performed at [facility], [address], [place], [place] [address]   Urine Culture (for pregnant, neutropenic or  urologic patients or patients with an indwelling urinary catheter)     Status: None   Collection Time: 03/23/23 10:20 AM   Specimen: Urine, Clean Catch  Result Value Ref Range Status   Specimen Description   Final    URINE, CLEAN CATCH Performed at [facility], [address], [place], [place] [address]    Special Requests   Final    NONE Performed at [facility], [address] [place], [place] [address]    Culture   Final    NO GROWTH Performed at [facility], [address], [place], [place] [address]    Report Status 03/24/2023 FINAL  Final     Labs: CBC: Recent Labs  Lab 03/21/23 2330 03/23/23 0839 03/24/23 0325 03/25/23 0332 03/26/23 0318  WBC 7.9 6.0 4.8 6.4 5.8  HGB 15.2* 13.1 12.3 12.8 12.6  HCT 48.1* 39.0 36.2  37.7 36.4  MCV 103.0* 95.6 96.3 95.2 92.9  PLT 173 150 141* 159 155   Basic Metabolic Panel: Recent Labs  Lab 03/21/23 2330 03/23/23 0839 03/24/23 0325 03/25/23 0332 03/26/23 0318  NA 135 139 136 137 135  K 4.2 4.3 3.8 3.9 3.7  CL 101 107 105 106 104  CO2 21* 25 26 24 24   GLUCOSE 135* 93 90 105* 102*  BUN 21 19 23  24* 21  CREATININE 0.97 0.80 0.84 0.73 0.73  CALCIUM 9.3 8.7* 8.2* 8.5* 8.5*  MG  --  1.8 2.0 2.1 2.0  PHOS  --  2.6 2.9 2.9 3.3   Liver Function Tests: No results for input(s): "AST", "ALT", "ALKPHOS", "BILITOT", "PROT", "ALBUMIN" in the last 168 hours. No results for input(s): "LIPASE", "AMYLASE" in the last 168 hours. No results for input(s): "AMMONIA" in the last 168 hours. Cardiac Enzymes: No results for input(s): "CKTOTAL", "CKMB", "CKMBINDEX", "TROPONINI" in the last 168 hours. BNP (last 3 results) No results for input(s): "BNP" in the last 8760 hours. CBG: Recent Labs  Lab 03/23/23 0955  GLUCAP 127*    Time spent: 35 minutes  Signed:  Gillis Santa  [facility] 03/26/2023 12:18 PM

## 2023-03-26 NOTE — Progress Notes (Signed)
Called and gave report to Konrad Dolores RN at [facility]  [phone]

## 2023-03-26 NOTE — TOC Progression Note (Signed)
Transition of Care [facility]) - Progression Note    Patient Details  Name: Joy Sanchez MRN: [id number] Date of Birth: 1935-07-09  [facility] CM/SW Contact  Colette Ribas, [place] Phone Number: 03/26/2023, 1:07 PM  Clinical Narrative:     CSW received message from nurse to arrange EMS and confirmed room available. [facility] they are ready for her room 805. Called EMS for transport and printed forms to the floor and notified nurses. EMS advised they will be here as soon as they can.       Expected Discharge Plan and Services         Expected Discharge Date: 03/26/23                                     Social Determinants of Health (SDOH) Interventions SDOH Screenings   Food Insecurity: Patient Declined (03/22/2023)  Housing: Patient Declined (03/22/2023)  Transportation Needs: Patient Declined (03/22/2023)  Utilities: Patient Declined (03/22/2023)  Tobacco Use: Low Risk  (03/21/2023)    Readmission Risk Interventions     No data to display

## 2023-03-26 NOTE — Plan of Care (Signed)
  Problem: Education: Goal: Knowledge of General Education information will improve Description Including pain rating scale, medication(s)/side effects and non-pharmacologic comfort measures Outcome: Progressing   Problem: Clinical Measurements: Goal: Ability to maintain clinical measurements within normal limits will improve Outcome: Progressing   Problem: Nutrition: Goal: Adequate nutrition will be maintained Outcome: Progressing   Problem: Coping: Goal: Level of anxiety will decrease Outcome: Progressing   Problem: Safety: Goal: Ability to remain free from injury will improve Outcome: Progressing   

## 2023-03-26 NOTE — Progress Notes (Signed)
Occupational Therapy Treatment Patient Details Name: Joy Sanchez MRN: [id number] DOB: January 18, 1935 Today's Date: 03/26/2023   History of present illness Pt is an 88 y/o female admitted secondary to generalized weakness. Pt with recurrent UTIs (failed outpatient management) and presenting with dehydration. Further work-up still pending (PNA vs atelectesis). Code stroke called on 8/12, MRI negative. PMH including but not limited to HTN, HLD, IIDM, recurrent UTIs, osteoporosis.   OT comments  Upon entering the room, pt supine in bed and RN reports pt may be transported to SNF soon via EMS. Forced use of R UE for grooming tasks with increased time, cuing, and encouragement. Pt with increased tone in R UE and performed AROM/AAROM exercises in all planes of movement x 10 reps each. Pt declined OOB and resting at end of session with all needs within reach.       If plan is discharge home, recommend the following:  A lot of help with walking and/or transfers;A lot of help with bathing/dressing/bathroom;Assistance with cooking/housework;Assist for transportation;Help with stairs or ramp for entrance;Direct supervision/assist for medications management;Direct supervision/assist for financial management   Equipment Recommendations  BSC/3in1       Precautions / Restrictions Precautions Precautions: Fall Precaution Comments: mild R inattention              ADL either performed or assessed with clinical judgement   ADL Overall ADL's : Needs assistance/impaired     Grooming: Set up;Supervision/safety;Wash/dry hands;Wash/dry face                                       Vision Patient Visual Report: No change from baseline            Cognition Arousal: Alert Behavior During Therapy: Flat affect Overall Cognitive Status: No family/caregiver present to determine baseline cognitive functioning                                                      Pertinent Vitals/ Pain       Pain Assessment Pain Assessment: No/denies pain         Frequency  Min 1X/week        Progress Toward Goals  OT Goals(current goals can now be found in the care plan section)  Progress towards OT goals: Progressing toward goals      AM-PAC OT "6 Clicks" Daily Activity     Outcome Measure   Help from another person eating meals?: A Little Help from another person taking care of personal grooming?: A Little Help from another person toileting, which includes using toliet, bedpan, or urinal?: Total Help from another person bathing (including washing, rinsing, drying)?: A Lot Help from another person to put on and taking off regular upper body clothing?: A Lot Help from another person to put on and taking off regular lower body clothing?: A Lot 6 Click Score: 13    End of Session    OT Visit Diagnosis: Other abnormalities of gait and mobility (R26.89);Hemiplegia and hemiparesis;Muscle weakness (generalized) (M62.81) Hemiplegia - Right/Left: Right Hemiplegia - dominant/non-dominant: Dominant   Activity Tolerance Patient tolerated treatment well   Patient Left in bed;with call bell/phone within reach;with bed alarm set   Nurse Communication Mobility status  Time: 8295-6213 OT Time Calculation (min): 18 min  Charges: OT General Charges $OT Visit: 1 Visit OT Treatments $Self Care/Home Management : 8-22 mins  Jackquline Denmark, MS, OTR/L , CBIS ascom [phone]  03/26/23, 3:01 PM

## 2023-05-21 ENCOUNTER — Other Ambulatory Visit: Payer: Self-pay

## 2023-05-21 ENCOUNTER — Emergency Department: Payer: Medicare Other

## 2023-05-21 ENCOUNTER — Emergency Department
Admission: EM | Admit: 2023-05-21 | Discharge: 2023-05-22 | Disposition: A | Discharge status: Other Acute Inpatient Hospital | Payer: Medicare Other | Attending: Emergency Medicine | Admitting: Emergency Medicine

## 2023-05-21 DIAGNOSIS — X500XXA Overexertion from strenuous movement or load, initial encounter: Secondary | ICD-10-CM | POA: Diagnosis not present

## 2023-05-21 DIAGNOSIS — M9711XA Periprosthetic fracture around internal prosthetic right knee joint, initial encounter: Secondary | ICD-10-CM | POA: Insufficient documentation

## 2023-05-21 DIAGNOSIS — S8991XA Unspecified injury of right lower leg, initial encounter: Secondary | ICD-10-CM | POA: Diagnosis present

## 2023-05-21 DIAGNOSIS — Y9301 Activity, walking, marching and hiking: Secondary | ICD-10-CM | POA: Diagnosis not present

## 2023-05-21 DIAGNOSIS — Z96651 Presence of right artificial knee joint: Secondary | ICD-10-CM | POA: Diagnosis not present

## 2023-05-21 DIAGNOSIS — S72409A Unspecified fracture of lower end of unspecified femur, initial encounter for closed fracture: Secondary | ICD-10-CM

## 2023-05-21 LAB — CBC WITH DIFFERENTIAL/PLATELET
Abs Immature Granulocytes: 0.06 10*3/uL (ref 0.00–0.07)
Basophils Absolute: 0 10*3/uL (ref 0.0–0.1)
Basophils Relative: 0 %
Eosinophils Absolute: 0 10*3/uL (ref 0.0–0.5)
Eosinophils Relative: 0 %
HCT: 36.4 % (ref 36.0–46.0)
Hemoglobin: 12.2 g/dL (ref 12.0–15.0)
Immature Granulocytes: 0 %
Lymphocytes Relative: 7 %
Lymphs Abs: 1.2 10*3/uL (ref 0.7–4.0)
MCH: 32.4 pg (ref 26.0–34.0)
MCHC: 33.5 g/dL (ref 30.0–36.0)
MCV: 96.6 fL (ref 80.0–100.0)
Monocytes Absolute: 1.3 10*3/uL — ABNORMAL HIGH (ref 0.1–1.0)
Monocytes Relative: 8 %
Neutro Abs: 13.8 10*3/uL — ABNORMAL HIGH (ref 1.7–7.7)
Neutrophils Relative %: 85 %
Platelets: 228 10*3/uL (ref 150–400)
RBC: 3.77 MIL/uL — ABNORMAL LOW (ref 3.87–5.11)
RDW: 13.5 % (ref 11.5–15.5)
WBC: 16.4 10*3/uL — ABNORMAL HIGH (ref 4.0–10.5)
nRBC: 0 % (ref 0.0–0.2)

## 2023-05-21 LAB — BASIC METABOLIC PANEL
Anion gap: 15 (ref 5–15)
BUN: 25 mg/dL — ABNORMAL HIGH (ref 8–23)
CO2: 22 mmol/L (ref 22–32)
Calcium: 9.2 mg/dL (ref 8.9–10.3)
Chloride: 100 mmol/L (ref 98–111)
Creatinine, Ser: 0.86 mg/dL (ref 0.44–1.00)
GFR, Estimated: >60 mL/min (ref >60)
Glucose, Bld: 147 mg/dL — ABNORMAL HIGH (ref 70–99)
Potassium: 4.5 mmol/L (ref 3.5–5.1)
Sodium: 137 mmol/L (ref 135–145)

## 2023-05-21 MED ORDER — ONDANSETRON HCL 4 MG/2ML IJ SOLN
4.0000 mg | Freq: Once | INTRAMUSCULAR | Status: AC
  Administered 2023-05-21: 4 mg via INTRAVENOUS
  Filled 2023-05-21: qty 2

## 2023-05-21 MED ORDER — MORPHINE SULFATE (PF) 2 MG/ML IV SOLN
2.0000 mg | INTRAVENOUS | Status: DC | PRN
  Administered 2023-05-21 – 2023-05-22 (×2): 2 mg via INTRAVENOUS
  Filled 2023-05-21 (×2): qty 1

## 2023-05-21 NOTE — ED Triage Notes (Signed)
First nurse note: pt to ED [facility] from home for right knee pain. VSS. Denies injury or fall.

## 2023-05-21 NOTE — ED Notes (Signed)
called to [facility] per MD Wong/request pt transfer/facesheet faxed/images powershared/rep:[email or web address]

## 2023-05-21 NOTE — ED Triage Notes (Signed)
Pt to ED via EMS c/o right knee pain. Pt states around 4pm she was getting up to go to bathroom with assistance of someone who is satying with her and said she twisted her right knee. Pt denies hitting her head, LOC, cp, sob, fevers, dizziness

## 2023-05-21 NOTE — ED Provider Notes (Addendum)
[facility] Provider Note    Event Date/Time   First MD Initiated Contact with Patient 05/21/23 2305     (approximate)   History   Knee Pain   HPI  Joy Sanchez is a 88 y.o. female   Past medical history of knee replacement 2006, presents to the emergency department after twisting her knee today while walking with her walker, and lowered to the ground gently by her caretaker, denies head strike, loss of consciousness, any other injuries or acute medical illnesses.  She cannot bear weight due to pain in the right knee  Independent Historian contributed to assessment above: Son is at bedside corroborates information given above  External Medical Documents Reviewed: Discharge summary from August 2024 admitted for UTI      Physical Exam   Triage Vital Signs: ED Triage Vitals  Encounter Vitals Group     BP 05/21/23 1948 109/68     Systolic BP Percentile --      Diastolic BP Percentile --      Pulse Rate 05/21/23 1948 90     Resp 05/21/23 1948 18     Temp 05/21/23 1948 98.1 F (36.7 C)     Temp Source 05/21/23 1948 Oral     SpO2 05/21/23 1948 95 %     Weight --      Height --      Head Circumference --      Peak Flow --      Pain Score 05/21/23 1905 2     Pain Loc --      Pain Education --      Exclude from Growth Chart --     Most recent vital signs: Vitals:   05/21/23 1948 05/21/23 2320  BP: 109/68 (!) 109/56  Pulse: 90 83  Resp: 18 16  Temp: 98.1 F (36.7 C) 98.2 F (36.8 C)  SpO2: 95% 95%    General: Awake, no distress.  CV:  Good peripheral perfusion.  Resp:  Normal effort.  Abd:  No distention.  Other:  Tenderness to palpation at the right knee, diffusely, soft compartments above and below, neurovascular intact, no signs of head trauma.  Hard of hearing and speech is slightly slurred but patient and son state that this is chronic and at baseline.   ED Results / Procedures / Treatments   Labs (all labs ordered are  listed, but only abnormal results are displayed) Labs Reviewed  CBC WITH DIFFERENTIAL/PLATELET - Abnormal; Notable for the following components:      Result Value   WBC 16.4 (*)    RBC 3.77 (*)    Neutro Abs 13.8 (*)    Monocytes Absolute 1.3 (*)    All other components within normal limits  BASIC METABOLIC PANEL     I ordered and reviewed the above labs they are notable for white blood cell count is elevated 16.4   RADIOLOGY I independently reviewed and interpreted x-ray of the right knee and see a distal femur fracture above hardware from knee replacement I also reviewed radiologist's formal read.   PROCEDURES:  Critical Care performed: No  Procedures   MEDICATIONS ORDERED IN ED: Medications  morphine (PF) 2 MG/ML injection 2 mg (has no administration in time range)  ondansetron (ZOFRAN) injection 4 mg (has no administration in time range)    External physician / consultants:  I spoke with Dr. Signa Kell of orthopedics regarding care plan for this patient.   IMPRESSION / MDM /  ASSESSMENT AND PLAN / ED COURSE  I reviewed the triage vital signs and the nursing notes.                                Patient's presentation is most consistent with acute presentation with potential threat to life or bodily function.  Differential diagnosis includes, but is not limited to, knee fracture, dislocation, periprosthetic fracture, hip fracture, compartment syndrome, neurovascular injury   The patient is on the cardiac monitor to evaluate for evidence of arrhythmia and/or significant heart rate changes.  MDM:    Is a patient with periprosthetic fracture of the right knee after twisting the knee, without any other complaints of injury pain and no other signs of injury on my exam, spoke with Dr. Signa Kell of orthopedics and unfortunately at [facility] will not have capabilities to perform surgical repair, advised transfer.  Patient states that she  believes Dr. Ernest Pine performed the surgery at [facility], but I can find no medical records in this surgery was supposedly done in 2006.  In any case, patient would like to be transferred to [facility] as her primary option.  Transfer process initiated to [facility], call from our IA at 2349.   [facility] at capacity, pending administrative review in the morning, will try [facility].  [facility] does not have the resources for this injury.  [facility] at capacity.  Accepted by [facility]     FINAL CLINICAL IMPRESSION(S) / ED DIAGNOSES   Final diagnoses:  Closed fracture of distal end of femur, unspecified fracture morphology, initial encounter (HCC)     Rx / DC Orders   ED Discharge Orders     None        Note:  This document was prepared using Dragon voice recognition software and may include unintentional dictation errors.    Pilar Jarvis, MD 05/21/23 2350    Pilar Jarvis, MD 05/21/23 8657    Pilar Jarvis, MD 05/22/23 [phone]

## 2023-05-22 DIAGNOSIS — M9711XA Periprosthetic fracture around internal prosthetic right knee joint, initial encounter: Secondary | ICD-10-CM | POA: Diagnosis not present

## 2023-05-22 NOTE — ED Notes (Signed)
called to [facility]) per MD Wong/request transfer/rep:ruby

## 2023-05-22 NOTE — ED Notes (Signed)
called to [facility] for pt transport /pt accepted for transport & will call when in route per Rep Onalee Hua.

## 2023-05-22 NOTE — ED Notes (Signed)
..  EMTALA: REQUIRED DOCUMENTATION COMPLETED AND REVIEWED BY WRITER PRIOR TO PT TRANSFER MD REASSESSMENT EMTALA RN SECTION TRANSFER E-SIGN VS WITHIN REQUIRED TIME

## 2023-05-22 NOTE — ED Notes (Signed)
Spoke with Rep Brandy/[facility] accepted pt ED to ED/ Accepting Dr. Blima Dessert Call Report [phone]

## 2023-09-12 DEATH — deceased
